# Patient Record
Sex: Male | Born: 1960 | Race: White | Hispanic: No | Marital: Married | Smoking: Current every day smoker
Health system: Southern US, Community
[De-identification: ages and names within clinical notes are randomized; demographics above are authoritative.]

## PROBLEM LIST (undated history)

## (undated) ENCOUNTER — Ambulatory Visit: Source: Home / Self Care

## (undated) DIAGNOSIS — I2510 Atherosclerotic heart disease of native coronary artery without angina pectoris: Secondary | ICD-10-CM

## (undated) DIAGNOSIS — E785 Hyperlipidemia, unspecified: Secondary | ICD-10-CM

## (undated) DIAGNOSIS — I214 Non-ST elevation (NSTEMI) myocardial infarction: Secondary | ICD-10-CM

## (undated) DIAGNOSIS — I10 Essential (primary) hypertension: Secondary | ICD-10-CM

## (undated) DIAGNOSIS — K219 Gastro-esophageal reflux disease without esophagitis: Secondary | ICD-10-CM

## (undated) DIAGNOSIS — F17200 Nicotine dependence, unspecified, uncomplicated: Secondary | ICD-10-CM

## (undated) HISTORY — DX: Non-ST elevation (NSTEMI) myocardial infarction: I21.4

## (undated) HISTORY — DX: Essential (primary) hypertension: I10

## (undated) HISTORY — DX: Gastro-esophageal reflux disease without esophagitis: K21.9

## (undated) HISTORY — PX: HAND SURGERY: SHX662

## (undated) HISTORY — DX: Hyperlipidemia, unspecified: E78.5

## (undated) HISTORY — DX: Nicotine dependence, unspecified, uncomplicated: F17.200

## (undated) HISTORY — DX: Atherosclerotic heart disease of native coronary artery without angina pectoris: I25.10

---

## 2000-11-18 ENCOUNTER — Encounter: Payer: Self-pay | Admitting: Emergency Medicine

## 2000-11-18 ENCOUNTER — Emergency Department (HOSPITAL_COMMUNITY): Admission: EM | Admit: 2000-11-18 | Discharge: 2000-11-18 | Payer: Self-pay | Admitting: Emergency Medicine

## 2000-11-22 ENCOUNTER — Ambulatory Visit (HOSPITAL_BASED_OUTPATIENT_CLINIC_OR_DEPARTMENT_OTHER): Admission: RE | Admit: 2000-11-22 | Discharge: 2000-11-22 | Payer: Self-pay | Admitting: Orthopedic Surgery

## 2000-12-04 ENCOUNTER — Encounter: Admission: RE | Admit: 2000-12-04 | Discharge: 2000-12-04 | Payer: Self-pay | Admitting: Orthopedic Surgery

## 2000-12-15 ENCOUNTER — Encounter: Admission: RE | Admit: 2000-12-15 | Discharge: 2001-03-15 | Payer: Self-pay | Admitting: Orthopedic Surgery

## 2007-02-05 ENCOUNTER — Emergency Department (HOSPITAL_COMMUNITY): Admission: EM | Admit: 2007-02-05 | Discharge: 2007-02-05 | Payer: Self-pay | Admitting: Emergency Medicine

## 2007-02-06 ENCOUNTER — Ambulatory Visit (HOSPITAL_COMMUNITY): Admission: RE | Admit: 2007-02-06 | Discharge: 2007-02-06 | Payer: Self-pay | Admitting: Emergency Medicine

## 2009-07-11 DIAGNOSIS — I2510 Atherosclerotic heart disease of native coronary artery without angina pectoris: Secondary | ICD-10-CM

## 2009-07-11 DIAGNOSIS — I214 Non-ST elevation (NSTEMI) myocardial infarction: Secondary | ICD-10-CM

## 2009-07-11 HISTORY — DX: Non-ST elevation (NSTEMI) myocardial infarction: I21.4

## 2009-07-11 HISTORY — DX: Atherosclerotic heart disease of native coronary artery without angina pectoris: I25.10

## 2010-01-28 ENCOUNTER — Ambulatory Visit: Payer: Self-pay | Admitting: Family Medicine

## 2010-01-28 ENCOUNTER — Inpatient Hospital Stay (HOSPITAL_COMMUNITY): Admission: EM | Admit: 2010-01-28 | Discharge: 2010-01-29 | Payer: Self-pay | Admitting: Emergency Medicine

## 2010-01-28 HISTORY — PX: CARDIAC CATHETERIZATION: SHX172

## 2010-02-12 ENCOUNTER — Ambulatory Visit: Payer: Self-pay | Admitting: Cardiology

## 2010-04-28 ENCOUNTER — Ambulatory Visit: Payer: Self-pay | Admitting: Cardiology

## 2010-09-25 LAB — CARDIAC PANEL(CRET KIN+CKTOT+MB+TROPI)
CK, MB: 57.1 ng/mL (ref 0.3–4.0)
Relative Index: 7.3 — ABNORMAL HIGH (ref 0.0–2.5)
Relative Index: 8.4 — ABNORMAL HIGH (ref 0.0–2.5)
Total CK: 681 U/L — ABNORMAL HIGH (ref 7–232)
Troponin I: 8.65 ng/mL (ref 0.00–0.06)

## 2010-09-25 LAB — BASIC METABOLIC PANEL
BUN: 10 mg/dL (ref 6–23)
CO2: 25 mEq/L (ref 19–32)
Calcium: 9.2 mg/dL (ref 8.4–10.5)
Chloride: 103 mEq/L (ref 96–112)
Chloride: 104 mEq/L (ref 96–112)
Creatinine, Ser: 0.79 mg/dL (ref 0.4–1.5)
GFR calc Af Amer: >60 mL/min (ref >60)
GFR calc Af Amer: >60 mL/min (ref >60)
GFR calc non Af Amer: >60 mL/min (ref >60)
Glucose, Bld: 140 mg/dL — ABNORMAL HIGH (ref 70–99)
Potassium: 3.7 mEq/L (ref 3.5–5.1)
Potassium: 4 mEq/L (ref 3.5–5.1)
Sodium: 136 mEq/L (ref 135–145)
Sodium: 137 mEq/L (ref 135–145)

## 2010-09-25 LAB — CBC
HCT: 41.8 % (ref 39.0–52.0)
Hemoglobin: 14.2 g/dL (ref 13.0–17.0)
Hemoglobin: 15.1 g/dL (ref 13.0–17.0)
MCHC: 34.1 g/dL (ref 30.0–36.0)
MCV: 98.6 fL (ref 78.0–100.0)
RBC: 4.24 MIL/uL (ref 4.22–5.81)
RBC: 4.5 MIL/uL (ref 4.22–5.81)
WBC: 10 10*3/uL (ref 4.0–10.5)

## 2010-09-25 LAB — LIPID PANEL
LDL Cholesterol: 97 mg/dL (ref 0–99)
VLDL: 49 mg/dL — ABNORMAL HIGH (ref 0–40)

## 2010-09-25 LAB — POCT CARDIAC MARKERS
CKMB, poc: <1 ng/mL — ABNORMAL LOW (ref 1.0–8.0)
Myoglobin, poc: 47.5 ng/mL (ref 12–200)
Myoglobin, poc: >500 ng/mL (ref 12–200)
Troponin i, poc: 0.32 ng/mL (ref 0.00–0.09)

## 2010-09-25 LAB — DIFFERENTIAL
Basophils Relative: 1 % (ref 0–1)
Lymphs Abs: 3.8 10*3/uL (ref 0.7–4.0)
Monocytes Relative: 6 % (ref 3–12)
Neutro Abs: 6.8 10*3/uL (ref 1.7–7.7)
Neutrophils Relative %: 59 % (ref 43–77)

## 2010-09-25 LAB — TSH: TSH: 1.783 u[IU]/mL (ref 0.350–4.500)

## 2010-09-25 LAB — CK TOTAL AND CKMB (NOT AT ARMC): CK, MB: 10.1 ng/mL (ref 0.3–4.0)

## 2010-09-25 LAB — MRSA PCR SCREENING: MRSA by PCR: NEGATIVE

## 2010-09-25 LAB — PROTIME-INR: Prothrombin Time: 13 seconds (ref 11.6–15.2)

## 2010-11-13 ENCOUNTER — Emergency Department: Payer: Self-pay | Admitting: Emergency Medicine

## 2010-11-26 NOTE — Op Note (Signed)
[place]  Patient:    Philip Thomas, Philip Thomas                       MRN: [id number] Proc. Date: 11/22/00 Adm. Date:  [id number] Attending:  Marlowe Shores                           Operative Report  PREOPERATIVE DIAGNOSIS:  Left hand dorsal laceration.  POSTOPERATIVE DIAGNOSIS:  Left hand dorsal laceration.  PROCEDURE:  Repair of left long finger extensor digitorum communis tendon.  SURGEON:  Artist Pais. Mina Marble, M.D.  ASSISTANT:  Junius Roads. Ireton, P.A.C.  ANESTHESIA:  Bier block.  TOURNIQUET TIME:  30 minutes.  COMPLICATIONS:  None.  DRAINS:  None.  DESCRIPTION OF PROCEDURE:  The patient was taken to the operating room and after induction of Bier block anesthesia, the left upper extremity was prepped and draped in the usual sterile fashion.  Once this was done a previous transverse laceration of the dorsal aspect of the left hand was reopened. Dissection was carried down to the dermal layers with an obvious laceration to the extensor digitorum communis tendon of the long finger was encountered. The EDC and EIP to the index were intact and the ring finger and small finger tendons were intact.  The tendon ends were debrided of clot and nonviable material and repair was then undertaken using 3-0 double armed Ethibond in a modified Tagima type suture followed by 6-0 running Prolene epitendinous stitch.  The wound was then thoroughly irrigated and closed with 3-0 Prolene subcuticular stitch.  Steri-Strips, 4 x 4 soft compressive dressing was applied, as well as a volar splint with fingers in extension.  The patient tolerated the procedure well and went to the recovery room in stable fashion. DD:  11/22/00 TD:  11/22/00 Job: [id number]

## 2010-12-24 ENCOUNTER — Encounter: Payer: Self-pay | Admitting: Cardiology

## 2010-12-24 ENCOUNTER — Other Ambulatory Visit: Payer: Self-pay | Admitting: *Deleted

## 2010-12-24 DIAGNOSIS — E785 Hyperlipidemia, unspecified: Secondary | ICD-10-CM

## 2010-12-30 ENCOUNTER — Other Ambulatory Visit: Payer: Self-pay | Admitting: Cardiology

## 2010-12-30 ENCOUNTER — Ambulatory Visit (INDEPENDENT_AMBULATORY_CARE_PROVIDER_SITE_OTHER): Payer: Self-pay | Admitting: Cardiology

## 2010-12-30 ENCOUNTER — Encounter: Payer: Self-pay | Admitting: Cardiology

## 2010-12-30 ENCOUNTER — Other Ambulatory Visit (INDEPENDENT_AMBULATORY_CARE_PROVIDER_SITE_OTHER): Payer: Self-pay | Admitting: *Deleted

## 2010-12-30 DIAGNOSIS — E785 Hyperlipidemia, unspecified: Secondary | ICD-10-CM | POA: Insufficient documentation

## 2010-12-30 DIAGNOSIS — I214 Non-ST elevation (NSTEMI) myocardial infarction: Secondary | ICD-10-CM | POA: Insufficient documentation

## 2010-12-30 DIAGNOSIS — I2510 Atherosclerotic heart disease of native coronary artery without angina pectoris: Secondary | ICD-10-CM

## 2010-12-30 DIAGNOSIS — I10 Essential (primary) hypertension: Secondary | ICD-10-CM

## 2010-12-30 DIAGNOSIS — F17200 Nicotine dependence, unspecified, uncomplicated: Secondary | ICD-10-CM | POA: Insufficient documentation

## 2010-12-30 DIAGNOSIS — I219 Acute myocardial infarction, unspecified: Secondary | ICD-10-CM

## 2010-12-30 LAB — BASIC METABOLIC PANEL
BUN: 8 mg/dL (ref 6–23)
CO2: 29 mEq/L (ref 19–32)
Chloride: 104 mEq/L (ref 96–112)
Creatinine, Ser: 0.9 mg/dL (ref 0.4–1.5)
Potassium: 4.5 mEq/L (ref 3.5–5.1)

## 2010-12-30 LAB — LDL CHOLESTEROL, DIRECT: Direct LDL: 85.9 mg/dL

## 2010-12-30 LAB — HEPATIC FUNCTION PANEL
AST: 42 U/L — ABNORMAL HIGH (ref 0–37)
Alkaline Phosphatase: 89 U/L (ref 39–117)
Total Bilirubin: 0.7 mg/dL (ref 0.3–1.2)

## 2010-12-30 LAB — LIPID PANEL
Total CHOL/HDL Ratio: 5
VLDL: 50.6 mg/dL — ABNORMAL HIGH (ref 0.0–40.0)

## 2010-12-30 NOTE — Patient Instructions (Signed)
We will call you with the results of your blood work today.  When you are ready to quit smoking let me know and we will prescribe Chantix for you. You need to quit!!  You need to get regular exercise--walking.  Reduce your ASA to 81 mg daily.  Continue your other medications.  I will see you back in 6 months.

## 2010-12-30 NOTE — Assessment & Plan Note (Signed)
He is doing well with minimal anginal symptoms. We will continue with aggressive risk factor modification. I would anticipate a followup stress test in one year.

## 2010-12-30 NOTE — Progress Notes (Signed)
   Philip Thomas Date of Birth: [birth year]   History of Present Illness: Philip Thomas is seen for followup today. He has been doing well from a cardiac standpoint. He's had very infrequent symptoms of mid sternal chest discomfort that are very mild and very short-lived. He has not had to use any nitroglycerin. He does continue to smoke one pack per day. He also admits that he is not exercising much. He is compliant with his medication.  Current Outpatient Prescriptions on File Prior to Visit  Medication Sig Dispense Refill  . acetaminophen (TYLENOL) 325 MG tablet Take 650 mg by mouth every 6 (six) hours as needed.        Marland Kitchen aspirin 325 MG tablet Take 325 mg by mouth daily.        . fish oil-omega-3 fatty acids 1000 MG capsule Take 4 g by mouth daily.        . metoprolol tartrate (LOPRESSOR) 25 MG tablet Take 12.5 mg by mouth 2 (two) times daily.        . nitroGLYCERIN (NITROSTAT) 0.4 MG SL tablet Place 0.4 mg under the tongue every 5 (five) minutes as needed.        Marland Kitchen omeprazole-sodium bicarbonate (ZEGERID) 40-1100 MG per capsule Take 1 capsule by mouth daily before breakfast.        . prasugrel (EFFIENT) 10 MG TABS Take 10 mg by mouth daily.        . simvastatin (ZOCOR) 40 MG tablet Take 40 mg by mouth at bedtime.          No Known Allergies  Past Medical History  Diagnosis Date  . MI, acute, non ST segment elevation   . Hypertension   . Dyslipidemia   . Tobacco dependence   . GERD (gastroesophageal reflux disease)   . Chest pain     Past Surgical History  Procedure Date  . Cardiac catheterization 01/28/2010  . Hand surgery     LEFT HAND    History  Smoking status  . Current Everyday Smoker -- 1.0 packs/day  Smokeless tobacco  . Not on file    History  Alcohol Use No    Family History  Problem Relation Age of Onset  . Hypertension Mother   . Stroke Mother   . Diabetes Mother   . Lung cancer Mother   . Emphysema Father     Review of Systems: The review of  systems is positive for continued tobacco abuse. He has had no bruising or bleeding.  All other systems were reviewed and are negative.  Physical Exam: BP 126/80  Pulse 64  Wt 182 lb (82.555 kg) He is an overweight white male in no acute distress. Is normocephalic, atraumatic. Pupils are equal round and reactive to light and accommodation. Extraocular movements are full. Oropharynx is clear. Neck is supple without JVD, adenopathy, thyromegaly, or bruits. Lungs are clear. Cardiac exam reveals a regular rate and rhythm without gallop or murmur. Abdomen is soft and nontender. He has no masses or bruits. Extremities are without edema. Pedal pulses are 2+. Neurologic exam is nonfocal. LABORATORY DATA: Chemistry panel and lipids are pending today.  Assessment / Plan:

## 2010-12-30 NOTE — Assessment & Plan Note (Signed)
We discussed smoking cessation strategies. He has failed nicotine replacement products in the past. I recommended Chantix when he is ready to quit. At this time he has not decided to quit but realizes that he needs to. When he has made his decision we will be happy to provide him with a prescription.

## 2010-12-30 NOTE — Assessment & Plan Note (Signed)
Blood pressure is well controlled on metoprolol. He will continue on this for blood pressure and for risk reduction post MI.

## 2010-12-30 NOTE — Assessment & Plan Note (Addendum)
We will followup on his fasting lab work today and make adjustments as needed. I have stressed the importance of a heart healthy diet and regular aerobic exercise.

## 2010-12-31 ENCOUNTER — Telehealth: Payer: Self-pay | Admitting: *Deleted

## 2010-12-31 DIAGNOSIS — E7800 Pure hypercholesterolemia, unspecified: Secondary | ICD-10-CM

## 2010-12-31 NOTE — Telephone Encounter (Signed)
Spoke w/wife. Notified her of lab work. Advised to get Niacin. Also scheduled app for 3 mo for repeat labs.

## 2010-12-31 NOTE — Telephone Encounter (Signed)
Message copied by Lorayne Bender on Fri Dec 31, 2010  1:13 PM ------      Message from: [place], PETER M      Created: Thu Dec 30, 2010  1:59 PM       Chemistries are ok. Triglycerides are high, HDL low, LDL better but not quite at goal. I would continue simvastatin and add niacin 500 mg daily. Increase to 1000 mg after 2 weeks. Recheck lab in 3 months.

## 2011-01-06 IMAGING — CR DG CHEST 2V
2 series · 2 of 2 positions shown · non-contrast
Comparison: None.

CLINICAL DATA: Chest pain.

CHEST - 2 VIEW

[w chest pa]
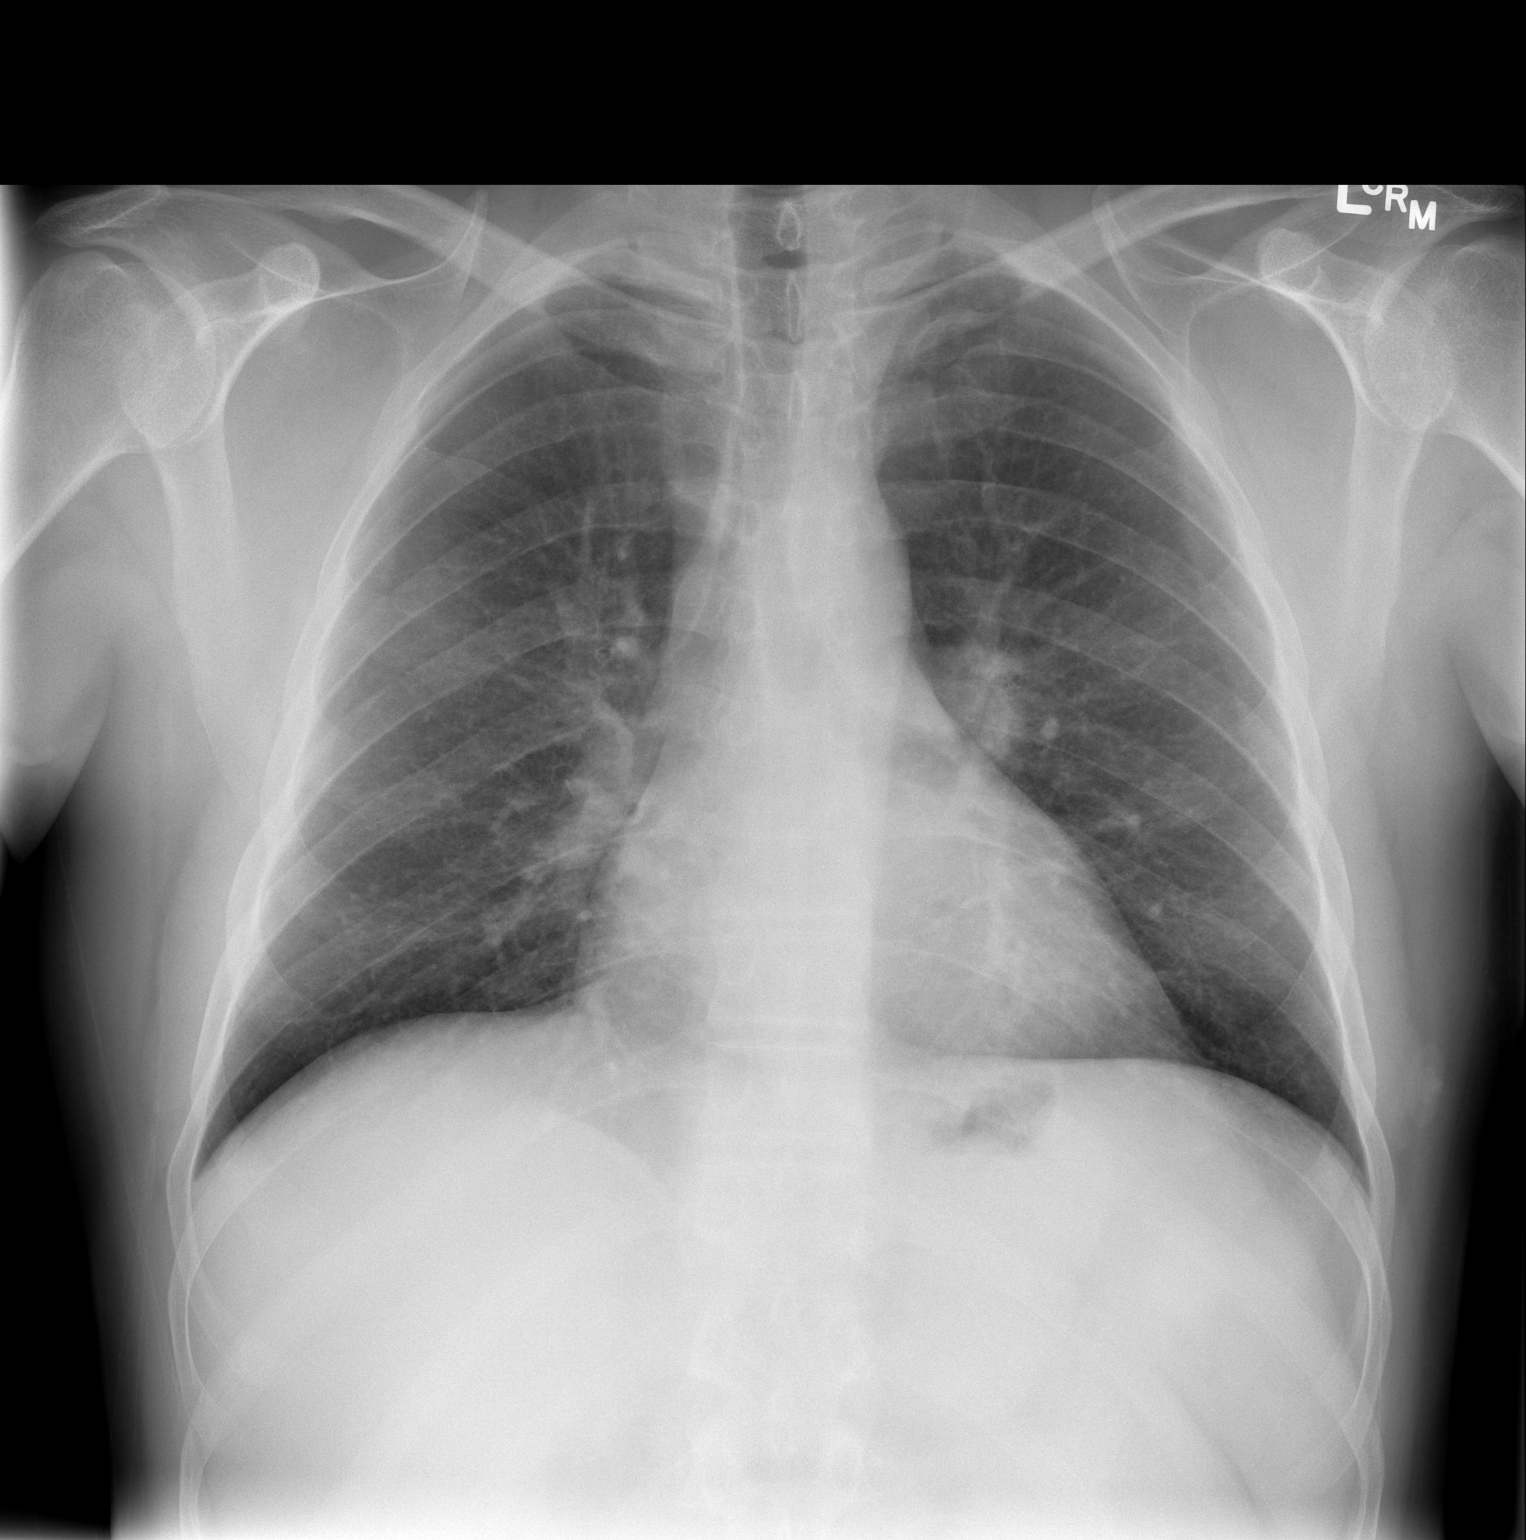

[w chest lat]
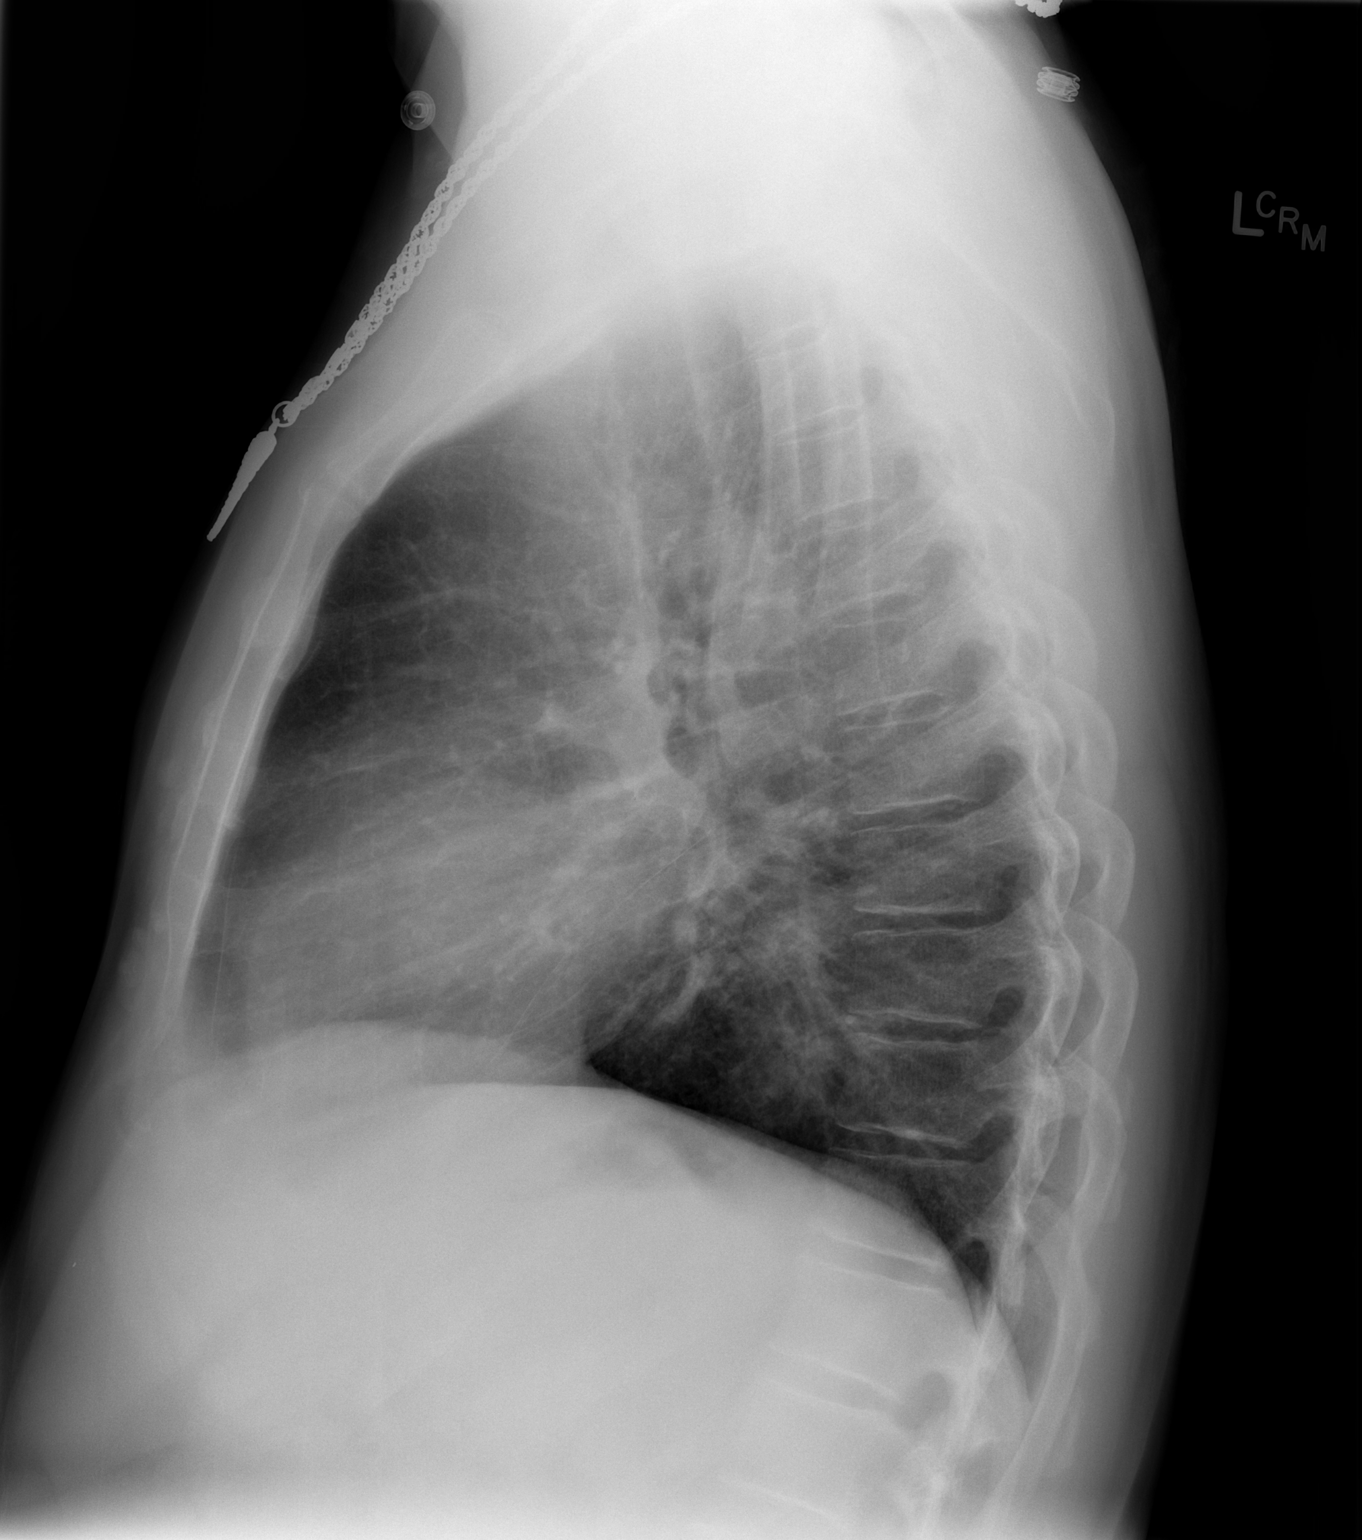

[2 of 2 positions shown; findings below may reference images not displayed]

FINDINGS: The heart size and mediastinal contours are within
normal limits.  Both lungs are clear.  The visualized skeletal
structures are unremarkable.
IMPRESSION: No active cardiopulmonary disease.

## 2011-01-07 ENCOUNTER — Encounter: Payer: Self-pay | Admitting: Cardiology

## 2011-01-14 ENCOUNTER — Telehealth: Payer: Self-pay | Admitting: Cardiology

## 2011-01-14 NOTE — Telephone Encounter (Signed)
Patient wife called about the [employer] application for patient assistance.  Someone called and said the application came in the mail.  She needs the physician page to send off.  Please call.

## 2011-01-14 NOTE — Telephone Encounter (Signed)
Wanted [place] to fill out physician portion of pt assist w/[facility] for Effient. Advised Dr. [place] would sign Mon and she wants me to fax to her at [phone].

## 2011-01-14 NOTE — Telephone Encounter (Signed)
CHART IN NURSES BOX.

## 2011-01-18 ENCOUNTER — Encounter: Payer: Self-pay | Admitting: Cardiology

## 2011-01-25 ENCOUNTER — Telehealth: Payer: Self-pay | Admitting: *Deleted

## 2011-01-25 NOTE — Telephone Encounter (Signed)
Notified that Effient (pt assis) was here. She will pick up.

## 2011-02-08 ENCOUNTER — Telehealth: Payer: Self-pay | Admitting: Cardiology

## 2011-02-08 NOTE — Telephone Encounter (Signed)
PT FAMILY CALLED, HE DOES NOT WANT TO TAKE THE NIACIN ANYMORE AND WANTS TO KNOW IF THERE IS SOMETHING ELSE??

## 2011-02-10 MED ORDER — METOPROLOL TARTRATE 25 MG PO TABS: 12.5000 mg | ORAL_TABLET | Freq: Two times a day (BID) | ORAL | Status: DC

## 2011-02-10 NOTE — Telephone Encounter (Signed)
Returned call after speaking w/Dr. [place]. Advised to d/c niacin and continue taking Fish Oil. Taking 1000 mg BID

## 2011-04-05 ENCOUNTER — Other Ambulatory Visit (INDEPENDENT_AMBULATORY_CARE_PROVIDER_SITE_OTHER): Payer: Self-pay | Admitting: *Deleted

## 2011-04-05 ENCOUNTER — Other Ambulatory Visit: Payer: Self-pay | Admitting: Cardiology

## 2011-04-05 DIAGNOSIS — E7800 Pure hypercholesterolemia, unspecified: Secondary | ICD-10-CM

## 2011-04-05 LAB — HEPATIC FUNCTION PANEL
ALT: 48 U/L (ref 0–53)
AST: 47 U/L — ABNORMAL HIGH (ref 0–37)
Albumin: 4 g/dL (ref 3.5–5.2)
Alkaline Phosphatase: 93 U/L (ref 39–117)
Total Protein: 7.2 g/dL (ref 6.0–8.3)

## 2011-04-05 LAB — BASIC METABOLIC PANEL
BUN: 10 mg/dL (ref 6–23)
CO2: 29 mEq/L (ref 19–32)
Chloride: 104 mEq/L (ref 96–112)
Glucose, Bld: 117 mg/dL — ABNORMAL HIGH (ref 70–99)
Potassium: 5 mEq/L (ref 3.5–5.1)
Sodium: 140 mEq/L (ref 135–145)

## 2011-04-05 LAB — LIPID PANEL: HDL: 31.3 mg/dL — ABNORMAL LOW (ref >39.00)

## 2011-04-05 LAB — LDL CHOLESTEROL, DIRECT: Direct LDL: 163.7 mg/dL

## 2011-04-06 ENCOUNTER — Telehealth: Payer: Self-pay | Admitting: *Deleted

## 2011-04-06 DIAGNOSIS — E7800 Pure hypercholesterolemia, unspecified: Secondary | ICD-10-CM

## 2011-04-06 MED ORDER — FENOFIBRATE 160 MG PO TABS: 160.0000 mg | ORAL_TABLET | Freq: Every day | ORAL | Status: DC

## 2011-04-06 MED ORDER — ATORVASTATIN CALCIUM 40 MG PO TABS: 40.0000 mg | ORAL_TABLET | Freq: Every day | ORAL | Status: DC

## 2011-04-06 NOTE — Telephone Encounter (Signed)
Notified wife of lab results. She states he has been out of Zocor and fish oil for "awhile". Will send Lipitor 40 mg and Fenofibrate 160 mg to [facility]. Will recheck labs in 3 mo

## 2011-04-06 NOTE — Telephone Encounter (Signed)
Message copied by Lorayne Bender on Wed Apr 06, 2011 12:08 PM ------      Message from: [place], PETER M      Created: Tue Apr 05, 2011  5:19 PM       Lipids are poorly controlled. It doesn't look like he's taking zocor.      I recommend stopping zocor. Start Lipitor 40 mg daily. Add fenofibrate 160 mg daily with food. Needs repeat lipid panel in 3 months.      Chemistries OK.      Theron Arista [place]

## 2011-06-08 ENCOUNTER — Other Ambulatory Visit: Payer: Self-pay | Admitting: *Deleted

## 2011-06-29 ENCOUNTER — Other Ambulatory Visit (INDEPENDENT_AMBULATORY_CARE_PROVIDER_SITE_OTHER): Payer: Self-pay | Admitting: *Deleted

## 2011-06-29 ENCOUNTER — Other Ambulatory Visit: Payer: Self-pay | Admitting: Cardiology

## 2011-06-29 ENCOUNTER — Ambulatory Visit (INDEPENDENT_AMBULATORY_CARE_PROVIDER_SITE_OTHER): Payer: Self-pay | Admitting: Cardiology

## 2011-06-29 ENCOUNTER — Encounter: Payer: Self-pay | Admitting: Cardiology

## 2011-06-29 ENCOUNTER — Other Ambulatory Visit: Payer: Self-pay | Admitting: *Deleted

## 2011-06-29 VITALS — BP 118/70 | HR 56 | Ht 64.0 in | Wt 185.0 lb

## 2011-06-29 DIAGNOSIS — I2510 Atherosclerotic heart disease of native coronary artery without angina pectoris: Secondary | ICD-10-CM

## 2011-06-29 DIAGNOSIS — E7800 Pure hypercholesterolemia, unspecified: Secondary | ICD-10-CM

## 2011-06-29 DIAGNOSIS — Z720 Tobacco use: Secondary | ICD-10-CM

## 2011-06-29 DIAGNOSIS — E785 Hyperlipidemia, unspecified: Secondary | ICD-10-CM

## 2011-06-29 DIAGNOSIS — I10 Essential (primary) hypertension: Secondary | ICD-10-CM

## 2011-06-29 DIAGNOSIS — F17200 Nicotine dependence, unspecified, uncomplicated: Secondary | ICD-10-CM

## 2011-06-29 LAB — BASIC METABOLIC PANEL
BUN: 11 mg/dL (ref 6–23)
Creatinine, Ser: 1 mg/dL (ref 0.4–1.5)
GFR: 83.21 mL/min (ref >60.00)
Glucose, Bld: 120 mg/dL — ABNORMAL HIGH (ref 70–99)

## 2011-06-29 LAB — HEPATIC FUNCTION PANEL
ALT: 31 U/L (ref 0–53)
Alkaline Phosphatase: 67 U/L (ref 39–117)
Bilirubin, Direct: 0 mg/dL (ref 0.0–0.3)
Total Protein: 7.1 g/dL (ref 6.0–8.3)

## 2011-06-29 LAB — LIPID PANEL
Cholesterol: 119 mg/dL (ref 0–200)
Total CHOL/HDL Ratio: 4

## 2011-06-29 NOTE — Assessment & Plan Note (Signed)
Prior lipid panel showed poor control. We have added fenofibrate to his Lipitor. We will recheck lipid panel today. Stressed the importance of dietary modification.

## 2011-06-29 NOTE — Progress Notes (Signed)
   Clayborn Heron Date of Birth: August 27, 1960   History of Present Illness: Mr. Barnhart is seen for followup today. He has been doing well from a cardiac standpoint. He denies any symptoms of chest pain or shortness of breath. He has not had to use any nitroglycerin. He has not made any progress with lifestyle modification. He does continue to smoke one and a half packs per day. He also admits that he is not exercising. He still has a fairly high fat intake in his diet. He is compliant with his medication.  Current Outpatient Prescriptions on File Prior to Visit  Medication Sig Dispense Refill  . acetaminophen (TYLENOL) 325 MG tablet Take 650 mg by mouth every 6 (six) hours as needed.        Marland Kitchen atorvastatin (LIPITOR) 40 MG tablet Take 1 tablet (40 mg total) by mouth daily.  30 tablet  5  . fenofibrate 160 MG tablet Take 1 tablet (160 mg total) by mouth daily.  30 tablet  5  . metoprolol tartrate (LOPRESSOR) 25 MG tablet Take 0.5 tablets (12.5 mg total) by mouth 2 (two) times daily.  90 tablet  3  . nitroGLYCERIN (NITROSTAT) 0.4 MG SL tablet Place 0.4 mg under the tongue every 5 (five) minutes as needed.        Marland Kitchen omeprazole-sodium bicarbonate (ZEGERID) 40-1100 MG per capsule Take 1 capsule by mouth daily before breakfast.        . prasugrel (EFFIENT) 10 MG TABS Take 10 mg by mouth daily.          No Known Allergies  Past Medical History  Diagnosis Date  . MI, acute, non ST segment elevation   . Hypertension   . Dyslipidemia   . Tobacco dependence   . GERD (gastroesophageal reflux disease)   . Chest pain   . CAD (coronary artery disease)     Past Surgical History  Procedure Date  . Cardiac catheterization 01/28/2010  . Hand surgery     LEFT HAND    History  Smoking status  . Current Everyday Smoker -- 1.0 packs/day  Smokeless tobacco  . Not on file    History  Alcohol Use No    Family History  Problem Relation Age of Onset  . Hypertension Mother   . Stroke Mother   .  Diabetes Mother   . Lung cancer Mother   . Emphysema Father     Review of Systems: The review of systems is positive for continued tobacco abuse. He has had no bruising or bleeding.  All other systems were reviewed and are negative.  Physical Exam: BP 118/70  Pulse 56  Ht 5\' 4"  (1.626 m)  Wt 83.915 kg (185 lb)  BMI 31.76 kg/m2 He is an overweight white male in no acute distress. He is normocephalic, atraumatic. Pupils are equal round and reactive to light and accommodation. Extraocular movements are full. Oropharynx is clear. Neck is supple without JVD, adenopathy, thyromegaly, or bruits. Lungs are clear. Cardiac exam reveals a regular rate and rhythm without gallop or murmur. Abdomen is soft and nontender. He has no masses or bruits. Extremities are without edema. Pedal pulses are 2+. Neurologic exam is nonfocal. LABORATORY DATA: Chemistry panel and lipids are pending today. ECG today shows sinus bradycardia with a rate of 56 beats per minute. It is otherwise normal. Assessment / Plan:

## 2011-06-29 NOTE — Telephone Encounter (Signed)
Was in office today. Needs refill on Effient 10 mg from [facility]. Form filled out and faxed to [phone]

## 2011-06-29 NOTE — Assessment & Plan Note (Addendum)
He remains asymptomatic. I stressed the importance of lifestyle modification including smoking cessation, heart healthy diet, and regular aerobic exercise. He will continue with his current medical therapy. He currently has patient assistance for Effient. If this expires we will need to switch him to Plavix. I will plan on seeing him back again in 6 months and would consider stress test at that time.

## 2011-06-29 NOTE — Assessment & Plan Note (Signed)
Discussed smoking cessation strategies at length for at least 10 minutes. Considered pharmacologic therapy. Patient is not ready to quit at this time.

## 2011-06-29 NOTE — Patient Instructions (Addendum)
We will call with the results of your lab work today.  You need to quit smoking. When you are ready to quit let me know and we can help with medication.  You need to reduce the saturated fats in your diet. Use olive oil. Eat more fresh fruits and vegetables, beans, fish, and whole grain.  I will see you again in 6 months. We will probably do a stress test next year.  You need regular aerobic exercise 30 minutes a day.

## 2011-10-07 ENCOUNTER — Other Ambulatory Visit: Payer: Self-pay | Admitting: Cardiology

## 2011-11-22 ENCOUNTER — Telehealth: Payer: Self-pay | Admitting: Cardiology

## 2011-11-22 NOTE — Telephone Encounter (Signed)
F/U   Patient wife Lanora Manis calling for status, she can be reached at [phone].

## 2011-11-22 NOTE — Telephone Encounter (Signed)
New msg Pt's wife wants to know about paperwork for effient.

## 2011-11-22 NOTE — Telephone Encounter (Signed)
Patient's wife called stated they have not received effient yet. Effient samples left at front desk 3rd floor.[facility] Form completed and will have Dr.Jordan sign tomorrow 11/23/11 and fax.Advised to call back if they dont receive.

## 2011-12-06 ENCOUNTER — Telehealth: Payer: Self-pay | Admitting: Cardiology

## 2011-12-06 NOTE — Telephone Encounter (Signed)
New msg Pt's wife called about effient. She said they get thru pt assistance. She hasnt heard anything

## 2011-12-06 NOTE — Telephone Encounter (Signed)
Spoke to patient's wife she stated has not received effient .[facility] [employer] assistance called they never received refill request .New refill request will be signed and faxed today 12/06/11 marked urgent case # [id number].

## 2011-12-20 ENCOUNTER — Telehealth: Payer: Self-pay | Admitting: Cardiology

## 2011-12-20 NOTE — Telephone Encounter (Signed)
Left pt a message to call back. 

## 2011-12-20 NOTE — Telephone Encounter (Signed)
New msg Pt's wife wanted to know status of pt assistance med-effient. Please call her back

## 2011-12-20 NOTE — Telephone Encounter (Signed)
Advised wife medication just came in and is ready to be picked up

## 2012-02-03 ENCOUNTER — Other Ambulatory Visit: Payer: Self-pay | Admitting: Cardiology

## 2012-04-26 ENCOUNTER — Telehealth: Payer: Self-pay | Admitting: Cardiology

## 2012-04-26 NOTE — Telephone Encounter (Signed)
Pt is almost out of meds and they where wondering has the information been sent to [facility] because they have not heard anything

## 2012-04-27 NOTE — Telephone Encounter (Signed)
Patient called was told [facility] Form for Effient faxed today 04/27/12.

## 2012-04-27 NOTE — Telephone Encounter (Signed)
Spoke to patient's wife she stated she faxed a form from [facility] for patient's effient.Wife was told did not receive form,she will refax form now.Requested effient samples.Samples of effient 10mg   left at front desk 3rd floor.

## 2012-05-04 ENCOUNTER — Other Ambulatory Visit: Payer: Self-pay | Admitting: Cardiology

## 2012-05-07 ENCOUNTER — Telehealth: Payer: Self-pay

## 2012-05-07 NOTE — Telephone Encounter (Signed)
New problem:   Wife calling - F/U  on the [facility] [employer] program.      Message is attached to refill request.

## 2012-05-07 NOTE — Telephone Encounter (Signed)
Spoke to patient's wife was told have not heard from [facility] about Effient samples.Advised to call next week if I haven't called her.

## 2012-05-08 NOTE — Telephone Encounter (Signed)
Patient's wife called no answer.Left message on personal voice mail spoke to [facility] patient's application has expired.New application needs to be filled out and signed by Dr.Jordan along with proof of income.Advised to bring to office next week Tuesday 05/15/12 proof of patient's income and I will have Dr.Jordan sign new application and fax to [facility].

## 2012-05-15 ENCOUNTER — Telehealth: Payer: Self-pay | Admitting: Cardiology

## 2012-05-15 NOTE — Telephone Encounter (Signed)
Please return call to pt wife [phone] regarding [employer] programs f/u   Neoma Laming, LPN 09/81/1914 7:82 PM Signed  Patient called was told [facility] Form for Effient faxed today 04/27/12.  Neoma Laming, LPN 95/62/1308 65:78 AM Signed  Spoke to patient's wife she stated she faxed a form from [facility] for patient's effient.Wife was told did not receive form,she will refax form now.Requested effient samples.Samples of effient 10mg  left at front desk 3rd floor.   Philip Thomas 04/26/2012 1:39 PM Signed  Pt is almost out of meds and they where wondering has the information been sent to [facility] because they have not heard anything

## 2012-05-15 NOTE — Telephone Encounter (Signed)
Spoke to patient's wife was told [employer] form is completed just needs Dr.Jordan's signature.Advised need patient's proof of income to fax with form.Wife stated she will bring proof of income 05/16/12.

## 2012-05-17 NOTE — Telephone Encounter (Signed)
[employer] Form signed by Dr.Jordan, received patient's proof of income,forms faxed to [employer] # [phone].

## 2012-05-28 ENCOUNTER — Telehealth: Payer: Self-pay | Admitting: Cardiology

## 2012-05-28 NOTE — Telephone Encounter (Signed)
New problem:   status of [facility] - medication .

## 2012-05-28 NOTE — Telephone Encounter (Signed)
**Note De-Identified Cyndi Montejano Obfuscation** Lanora Manis is advised that the form has been sent to [facility] care and we are waiting on response. Will forward to Anabel Halon, Dr. Elvis Coil nurse.

## 2012-05-30 ENCOUNTER — Telehealth: Payer: Self-pay | Admitting: Cardiology

## 2012-05-30 NOTE — Telephone Encounter (Signed)
pt's wife calling re [facility] [employer] assitance program you discussed with her

## 2012-05-31 NOTE — Telephone Encounter (Signed)
See note 05/31/12.

## 2012-05-31 NOTE — Telephone Encounter (Signed)
Spoke with patient's wife 05/30/12 was told refaxed [facility] application.[facility] [employer] called 05/31/12 and they did receive application.Will follow up 05/31/12 to see if approved.

## 2012-06-01 NOTE — Telephone Encounter (Signed)
Patient called spoke to wife was told received fax from [facility], effient approved and has been shipped,will call back when we receive.

## 2012-06-08 ENCOUNTER — Other Ambulatory Visit: Payer: Self-pay

## 2012-06-08 MED ORDER — METOPROLOL TARTRATE 25 MG PO TABS: 12.5000 mg | ORAL_TABLET | Freq: Two times a day (BID) | ORAL | Status: DC

## 2012-06-12 NOTE — Telephone Encounter (Signed)
Patient's wife called was told received Effient from [employer] leave at 3rd floor front desk.

## 2012-08-28 ENCOUNTER — Telehealth: Payer: Self-pay | Admitting: Cardiology

## 2012-08-28 DIAGNOSIS — I10 Essential (primary) hypertension: Secondary | ICD-10-CM

## 2012-08-28 DIAGNOSIS — E785 Hyperlipidemia, unspecified: Secondary | ICD-10-CM

## 2012-08-28 NOTE — Telephone Encounter (Signed)
Pt needs tooth pulled , does he need any prep? [phone]

## 2012-08-28 NOTE — Telephone Encounter (Signed)
Left message for call back.

## 2012-09-06 NOTE — Telephone Encounter (Signed)
New Prob   Has a question regarding medication metoprolol. Would like to speak to nurse.

## 2012-09-07 NOTE — Telephone Encounter (Signed)
Spoke with patient's wife 09/06/12 she stated when she picked up metoprolol the pill was smaller pharmacist told her it was the same just changed suppliers that is why it was smaller.States this one is hard to cut in half.States will try to cut in half with pill cutter and if has problems will call back.Also states patient did not receive follow up visit.Appointment scheduled with Norma Fredrickson NP 09/17/12 will have fasting lab work that day.

## 2012-09-17 ENCOUNTER — Ambulatory Visit (INDEPENDENT_AMBULATORY_CARE_PROVIDER_SITE_OTHER): Payer: Self-pay | Admitting: Nurse Practitioner

## 2012-09-17 ENCOUNTER — Other Ambulatory Visit (INDEPENDENT_AMBULATORY_CARE_PROVIDER_SITE_OTHER): Payer: Self-pay

## 2012-09-17 ENCOUNTER — Encounter: Payer: Self-pay | Admitting: Nurse Practitioner

## 2012-09-17 VITALS — BP 130/70 | HR 56 | Ht 64.0 in | Wt 188.1 lb

## 2012-09-17 DIAGNOSIS — I2510 Atherosclerotic heart disease of native coronary artery without angina pectoris: Secondary | ICD-10-CM

## 2012-09-17 DIAGNOSIS — E785 Hyperlipidemia, unspecified: Secondary | ICD-10-CM

## 2012-09-17 DIAGNOSIS — I10 Essential (primary) hypertension: Secondary | ICD-10-CM

## 2012-09-17 LAB — LDL CHOLESTEROL, DIRECT: Direct LDL: 69.2 mg/dL

## 2012-09-17 LAB — BASIC METABOLIC PANEL
Calcium: 9.1 mg/dL (ref 8.4–10.5)
Chloride: 106 mEq/L (ref 96–112)
Creatinine, Ser: 1 mg/dL (ref 0.4–1.5)

## 2012-09-17 LAB — HEPATIC FUNCTION PANEL
AST: 34 U/L (ref 0–37)
Total Bilirubin: 0.8 mg/dL (ref 0.3–1.2)

## 2012-09-17 LAB — LIPID PANEL
Cholesterol: 126 mg/dL (ref 0–200)
Total CHOL/HDL Ratio: 6
VLDL: 54 mg/dL — ABNORMAL HIGH (ref 0.0–40.0)

## 2012-09-17 MED ORDER — METOPROLOL SUCCINATE ER 25 MG PO TB24: 25.0000 mg | ORAL_TABLET | Freq: Every day | ORAL | Status: DC

## 2012-09-17 NOTE — Patient Instructions (Addendum)
Try to work on your smoking.  We will check labs today  Stay active  We will try the extended release version of Metoprolol at 25 mg just once a day in the place of the Metoprolol tartrate  We will see you back in 6 months with fasting labs

## 2012-09-17 NOTE — Progress Notes (Signed)
Philip Thomas Date of Birth: [birth year] Medical Record #[id number]  History of Present Illness: Philip Thomas is seen back today for a follow up visit. He is seen for Dr. Swaziland. Last seen in December of 2012. He has known CAD with past stenting of the proximal RCA with a DES, HLD, tobacco dependence, and GERD.   He comes in today. He is here with his wife. He is doing ok. No chest pain reported. Has stable DOE that is unchanged. Continues to smoke. Not ready to stop. Taking his medicines. Does note that his Lopressor is very small (supplier had changed) and it is very difficult to cut. They both are trying to get insurance and are currently uninsured. Does have some constant right shoulder pain with difficult ROM.   Current Outpatient Prescriptions on File Prior to Visit  Medication Sig Dispense Refill  . acetaminophen (TYLENOL) 325 MG tablet Take 650 mg by mouth every 6 (six) hours as needed.        Marland Kitchen aspirin 81 MG tablet Take 81 mg by mouth daily.        Marland Kitchen atorvastatin (LIPITOR) 40 MG tablet TAKE ONE TABLET BY MOUTH EVERY DAY  30 tablet  5  . fenofibrate 160 MG tablet TAKE ONE TABLET BY MOUTH EVERY DAY  30 tablet  5  . nitroGLYCERIN (NITROSTAT) 0.4 MG SL tablet Place 0.4 mg under the tongue every 5 (five) minutes as needed.        Marland Kitchen omeprazole-sodium bicarbonate (ZEGERID) 40-1100 MG per capsule Take 1 capsule by mouth daily before breakfast.        . prasugrel (EFFIENT) 10 MG TABS Take 10 mg by mouth daily.         No current facility-administered medications on file prior to visit.    No Known Allergies  Past Medical History  Diagnosis Date  . NSTEMI (non-ST elevated myocardial infarction) 2011  . Hypertension   . Dyslipidemia   . Tobacco dependence   . GERD (gastroesophageal reflux disease)   . CAD (coronary artery disease) 2011    DES to the proximal RCA    Past Surgical History  Procedure Laterality Date  . Cardiac catheterization  01/28/2010  . Hand surgery      LEFT HAND     History  Smoking status  . Current Every Day Smoker -- 1.00 packs/day  Smokeless tobacco  . Not on file    History  Alcohol Use No    Family History  Problem Relation Age of Onset  . Hypertension Mother   . Stroke Mother   . Diabetes Mother   . Lung cancer Mother   . Emphysema Father     Review of Systems: The review of systems is per the HPI.  All other systems were reviewed and are negative.  Physical Exam: BP 130/70  Pulse 56  Ht 5\' 4"  (1.626 m)  Wt 188 lb 1.9 oz (85.331 kg)  BMI 32.27 kg/m2 Patient is very pleasant and in no acute distress. Skin is warm and dry. Color is normal.  HEENT is unremarkable. Dentition is poor. Normocephalic/atraumatic. PERRL. Sclera are nonicteric. Neck is supple. No masses. No JVD. Lungs are coarse. Cardiac exam shows a regular rate and rhythm. Abdomen is soft. Extremities are without edema. Gait and ROM are intact. No gross neurologic deficits noted.'  LABORATORY DATA: Pending  Lab Results  Component Value Date   WBC 10.0 01/29/2010   HGB 14.2 01/29/2010   HCT 41.8 01/29/2010   PLT  226 01/29/2010   GLUCOSE 120* 06/29/2011   CHOL 119 06/29/2011   TRIG 214.0* 06/29/2011   HDL 27.80* 06/29/2011   LDLDIRECT 61.6 06/29/2011   LDLCALC  Value: 97        Total Cholesterol/HDL:CHD Risk Coronary Heart Disease Risk Table                     Men   Women  1/2 Average Risk   3.4   3.3  Average Risk       5.0   4.4  2 X Average Risk   9.6   7.1  3 X Average Risk  23.4   11.0        Use the calculated Patient Ratio above and the CHD Risk Table to determine the patient's CHD Risk.        ATP III CLASSIFICATION (LDL):  <100     mg/dL   Optimal  161-096  mg/dL   Near or Above                    Optimal  130-159  mg/dL   Borderline  045-409  mg/dL   High  >811     mg/dL   Very High 03/24/7828   ALT 31 06/29/2011   AST 29 06/29/2011   NA 140 06/29/2011   K 4.2 06/29/2011   CL 109 06/29/2011   CREATININE 1.0 06/29/2011   BUN 11 06/29/2011   CO2 24  06/29/2011   TSH 1.783 01/28/2010   INR 0.99 01/28/2010   HGBA1C  Value: 5.4 (NOTE)                                                                       According to the ADA Clinical Practice Recommendations for 2011, when HbA1c is used as a screening test:   >=6.5%   Diagnostic of Diabetes Mellitus           (if abnormal result  is confirmed)  5.7-6.4%   Increased risk of developing Diabetes Mellitus  References:Diagnosis and Classification of Diabetes Mellitus,Diabetes Care,2011,34(Suppl 1):S62-S69 and Standards of Medical Care in         Diabetes - 2011,Diabetes Care,2011,34  (Suppl 1):S11-S61. 01/28/2010   Assessment / Plan: 1. CAD with past NSTEMI and DES to the RCA. Minimal residual disease. Continues to smoke. No recurrent chest pain. I wanted to proceed with stress Myoview testing, but in light of his not having insurance, I held off. We will see him back in 6 months. He is currently without symptoms. Will try Toprol 25 mg daily in the place of his Lopressor and if the cost is not prohibitive.   2. HTN - BP is ok.   3. HLD - checking labs today  4. Tobacco abuse - he is not ready to stop.  We will tentatively see him back in 6 months. Smoking cessation was encouraged.   Patient is agreeable to this plan and will call if any problems develop in the interim.

## 2012-09-20 ENCOUNTER — Other Ambulatory Visit: Payer: Self-pay

## 2012-09-20 MED ORDER — OMEGA-3 FATTY ACIDS 1000 MG PO CAPS: ORAL_CAPSULE | ORAL | Status: DC

## 2012-10-09 ENCOUNTER — Telehealth: Payer: Self-pay | Admitting: Cardiology

## 2012-10-09 NOTE — Telephone Encounter (Signed)
Spoke with patient's wife was told will have Dr.Jordan sign [employer] form 10/10/12 and then will fax to [facility].Samples of effient 10 mg left at 3rd floor front desk.

## 2012-10-09 NOTE — Telephone Encounter (Signed)
New Problem:    Calleld in wanting to check on the status of her husbands refill of prasugrel (EFFIENT) 10 MG TABS through the [facility].  Please call back.

## 2012-10-15 NOTE — Telephone Encounter (Signed)
[employer] form for effient signed by Dr.Jordan and faxed to # [phone] 10/10/12.

## 2012-11-01 ENCOUNTER — Other Ambulatory Visit: Payer: Self-pay | Admitting: Cardiology

## 2012-12-05 ENCOUNTER — Telehealth: Payer: Self-pay | Admitting: Cardiology

## 2012-12-05 NOTE — Telephone Encounter (Signed)
Returned call to patient spoke to his wife she stated husband had episode last night in which he became dizzy felt like objects spinning,nausea vomited x 2.No chest pain.No sob.Stated he was feeling better this morning.Advised if symptoms return he will need to see PCP.Appointment scheduled with Dr.Jordan 01/30/13.Advised to call sooner if needed.

## 2012-12-05 NOTE — Telephone Encounter (Signed)
New Prob     Wife would like to discuss some issues pt was having last night (dizziness and throwing up) with nurse. Please call.

## 2012-12-07 ENCOUNTER — Other Ambulatory Visit: Payer: Self-pay | Admitting: Cardiology

## 2013-01-30 ENCOUNTER — Encounter: Payer: Self-pay | Admitting: Cardiology

## 2013-01-30 ENCOUNTER — Ambulatory Visit (INDEPENDENT_AMBULATORY_CARE_PROVIDER_SITE_OTHER): Payer: BC Managed Care – PPO | Admitting: Cardiology

## 2013-01-30 VITALS — BP 130/70 | HR 62 | Ht 64.0 in | Wt 187.1 lb

## 2013-01-30 DIAGNOSIS — I10 Essential (primary) hypertension: Secondary | ICD-10-CM

## 2013-01-30 DIAGNOSIS — E785 Hyperlipidemia, unspecified: Secondary | ICD-10-CM

## 2013-01-30 DIAGNOSIS — I2510 Atherosclerotic heart disease of native coronary artery without angina pectoris: Secondary | ICD-10-CM

## 2013-01-30 DIAGNOSIS — F17200 Nicotine dependence, unspecified, uncomplicated: Secondary | ICD-10-CM

## 2013-01-30 NOTE — Patient Instructions (Signed)
Stop Effient.   Continue your other medication.  Stop smoking and eat a heart healthy diet.  Get regular aerobic exercise- walk 30 minutes a day.  I will see you in 6 months.

## 2013-01-30 NOTE — Progress Notes (Signed)
Clayborn Heron Date of Birth: March 11, 1961 Medical Record #[id number]  History of Present Illness: Philip Thomas is seen back today for a follow up visit. Marland Kitchen He has known CAD with past stenting of the proximal RCA with a DES in July of 2011, HLD, tobacco dependence, and GERD.  On followup today he reports that he is doing well. He did have an episode 3 weeks ago where he became acutely nauseated with vomiting, vertigo, and feeling like he was going to pass out. The symptoms all resolved and he has had no recurrence. He is not getting any regular exercise. His wife reports that he likes eating fried food and pork. He continues to smoke.  Current Outpatient Prescriptions on File Prior to Visit  Medication Sig Dispense Refill  . acetaminophen (TYLENOL) 325 MG tablet Take 650 mg by mouth every 6 (six) hours as needed.        Marland Kitchen aspirin 81 MG tablet Take 81 mg by mouth daily.        Marland Kitchen atorvastatin (LIPITOR) 40 MG tablet TAKE ONE TABLET BY MOUTH ONCE DAILY  30 tablet  3  . fenofibrate 160 MG tablet TAKE ONE TABLET BY MOUTH ONCE DAILY  30 tablet  3  . fish oil-omega-3 fatty acids 1000 MG capsule Take 2 Gms twice a day ( total 4 Gms daily )  100 capsule  6  . metoprolol succinate (TOPROL XL) 25 MG 24 hr tablet Take 1 tablet (25 mg total) by mouth daily.  30 tablet  11  . nitroGLYCERIN (NITROSTAT) 0.4 MG SL tablet Place 0.4 mg under the tongue every 5 (five) minutes as needed.         No current facility-administered medications on file prior to visit.    No Known Allergies  Past Medical History  Diagnosis Date  . NSTEMI (non-ST elevated myocardial infarction) 2011  . Hypertension   . Dyslipidemia   . Tobacco dependence   . GERD (gastroesophageal reflux disease)   . CAD (coronary artery disease) 2011    DES to the proximal RCA    Past Surgical History  Procedure Laterality Date  . Cardiac catheterization  01/28/2010  . Hand surgery      LEFT HAND    History  Smoking status  . Current Every Day  Smoker -- 1.00 packs/day  Smokeless tobacco  . Not on file    History  Alcohol Use No    Family History  Problem Relation Age of Onset  . Hypertension Mother   . Stroke Mother   . Diabetes Mother   . Lung cancer Mother   . Emphysema Father     Review of Systems: The review of systems is per the HPI.  All other systems were reviewed and are negative.  Physical Exam: BP 130/70  Pulse 62  Ht 5\' 4"  (1.626 m)  Wt 187 lb 2 oz (84.879 kg)  BMI 32.1 kg/m2 Patient is very pleasant and in no acute distress. Skin is warm and dry. Color is normal.  HEENT is unremarkable. Dentition is poor. Normocephalic/atraumatic. PERRL. Sclera are nonicteric. Neck is supple. No masses. No JVD. Lungs are coarse. Cardiac exam shows a regular rate and rhythm. Abdomen is soft. Extremities are without edema. Gait and ROM are intact. No gross neurologic deficits noted.'  LABORATORY DATA:   Lab Results  Component Value Date   WBC 10.0 01/29/2010   HGB 14.2 01/29/2010   HCT 41.8 01/29/2010   PLT 226 01/29/2010   GLUCOSE 116*  09/17/2012   CHOL 126 09/17/2012   TRIG 270.0* 09/17/2012   HDL 20.10* 09/17/2012   LDLDIRECT 69.2 09/17/2012   LDLCALC  Value: 97        Total Cholesterol/HDL:CHD Risk Coronary Heart Disease Risk Table                     Men   Women  1/2 Average Risk   3.4   3.3  Average Risk       5.0   4.4  2 X Average Risk   9.6   7.1  3 X Average Risk  23.4   11.0        Use the calculated Patient Ratio above and the CHD Risk Table to determine the patient's CHD Risk.        ATP III CLASSIFICATION (LDL):  <100     mg/dL   Optimal  536-644  mg/dL   Near or Above                    Optimal  130-159  mg/dL   Borderline  034-742  mg/dL   High  >595     mg/dL   Very High 6/38/7564   ALT 37 09/17/2012   AST 34 09/17/2012   NA 137 09/17/2012   K 4.1 09/17/2012   CL 106 09/17/2012   CREATININE 1.0 09/17/2012   BUN 12 09/17/2012   CO2 24 09/17/2012   TSH 1.783 01/28/2010   INR 0.99 01/28/2010   HGBA1C  Value: 5.4  (NOTE)                                                                       According to the ADA Clinical Practice Recommendations for 2011, when HbA1c is used as a screening test:   >=6.5%   Diagnostic of Diabetes Mellitus           (if abnormal result  is confirmed)  5.7-6.4%   Increased risk of developing Diabetes Mellitus  References:Diagnosis and Classification of Diabetes Mellitus,Diabetes Care,2011,34(Suppl 1):S62-S69 and Standards of Medical Care in         Diabetes - 2011,Diabetes Care,2011,34  (Suppl 1):S11-S61. 01/28/2010   Assessment / Plan: 1. CAD with past NSTEMI and DES to the RCA in 2011.  Continues to smoke. No recurrent chest pain. I recommended stopping Effient now. Continue aspirin. Continue statin therapy and metoprolol. I recommended smoking cessation. Have recommended daily aerobic exercise. I've counseled him on a heart healthy diet. I'll followup again in 6 months.  2. HTN - BP is ok.   3. HLD - checking labs today  4. Tobacco abuse - he is not ready to stop. Counseling given.

## 2013-04-08 ENCOUNTER — Other Ambulatory Visit: Payer: Self-pay | Admitting: Cardiology

## 2013-05-16 ENCOUNTER — Other Ambulatory Visit: Payer: Self-pay

## 2013-10-03 ENCOUNTER — Other Ambulatory Visit: Payer: Self-pay | Admitting: Nurse Practitioner

## 2013-10-14 ENCOUNTER — Encounter: Payer: Self-pay | Admitting: Cardiology

## 2013-11-05 ENCOUNTER — Other Ambulatory Visit: Payer: Self-pay | Admitting: Cardiology

## 2013-11-05 ENCOUNTER — Other Ambulatory Visit: Payer: Self-pay | Admitting: *Deleted

## 2013-11-05 MED ORDER — ATORVASTATIN CALCIUM 40 MG PO TABS: ORAL_TABLET | ORAL | Status: DC

## 2013-11-05 MED ORDER — FENOFIBRATE 160 MG PO TABS: ORAL_TABLET | ORAL | Status: DC

## 2013-12-06 ENCOUNTER — Other Ambulatory Visit: Payer: Self-pay

## 2013-12-06 MED ORDER — METOPROLOL SUCCINATE ER 25 MG PO TB24: ORAL_TABLET | ORAL | Status: DC

## 2013-12-30 ENCOUNTER — Ambulatory Visit (INDEPENDENT_AMBULATORY_CARE_PROVIDER_SITE_OTHER): Payer: BC Managed Care – PPO | Admitting: Cardiology

## 2013-12-30 ENCOUNTER — Encounter: Payer: Self-pay | Admitting: Cardiology

## 2013-12-30 VITALS — BP 130/78 | HR 56 | Ht 64.0 in | Wt 189.0 lb

## 2013-12-30 DIAGNOSIS — I10 Essential (primary) hypertension: Secondary | ICD-10-CM

## 2013-12-30 DIAGNOSIS — F17200 Nicotine dependence, unspecified, uncomplicated: Secondary | ICD-10-CM

## 2013-12-30 DIAGNOSIS — E785 Hyperlipidemia, unspecified: Secondary | ICD-10-CM

## 2013-12-30 DIAGNOSIS — I2510 Atherosclerotic heart disease of native coronary artery without angina pectoris: Secondary | ICD-10-CM

## 2013-12-30 LAB — HEPATIC FUNCTION PANEL
ALT: 41 U/L (ref 0–53)
AST: 46 U/L — ABNORMAL HIGH (ref 0–37)
Albumin: 4 g/dL (ref 3.5–5.2)
Alkaline Phosphatase: 58 U/L (ref 39–117)
Bilirubin, Direct: 0.1 mg/dL (ref 0.0–0.3)
TOTAL PROTEIN: 7.3 g/dL (ref 6.0–8.3)
Total Bilirubin: 0.5 mg/dL (ref 0.2–1.2)

## 2013-12-30 LAB — LIPID PANEL
CHOLESTEROL: 127 mg/dL (ref 0–200)
HDL: 25.7 mg/dL — AB (ref >39.00)
LDL Cholesterol: 56 mg/dL (ref 0–99)
NonHDL: 101.3
Total CHOL/HDL Ratio: 5
Triglycerides: 227 mg/dL — ABNORMAL HIGH (ref 0.0–149.0)
VLDL: 45.4 mg/dL — AB (ref 0.0–40.0)

## 2013-12-30 LAB — BASIC METABOLIC PANEL
BUN: 10 mg/dL (ref 6–23)
CO2: 24 mEq/L (ref 19–32)
Calcium: 9.4 mg/dL (ref 8.4–10.5)
Chloride: 106 mEq/L (ref 96–112)
Creatinine, Ser: 1 mg/dL (ref 0.4–1.5)
GFR: 87.45 mL/min (ref >60.00)
Glucose, Bld: 110 mg/dL — ABNORMAL HIGH (ref 70–99)
POTASSIUM: 4.2 meq/L (ref 3.5–5.1)
SODIUM: 138 meq/L (ref 135–145)

## 2013-12-30 MED ORDER — FENOFIBRATE 160 MG PO TABS: ORAL_TABLET | ORAL | Status: DC

## 2013-12-30 MED ORDER — METOPROLOL SUCCINATE ER 25 MG PO TB24: ORAL_TABLET | ORAL | Status: DC

## 2013-12-30 MED ORDER — ATORVASTATIN CALCIUM 40 MG PO TABS: ORAL_TABLET | ORAL | Status: DC

## 2013-12-30 NOTE — Patient Instructions (Signed)
Your physician recommends that you continue on your current medications as directed. Please refer to the Current Medication list given to you today.  Your physician recommends that you go to the lab today for a BMET, LIPID and Hepatic Panel  Your physician has requested that you have an exercise tolerance test. For further information please visit [email or web address] Please also follow instruction sheet, as given.  Your physician wants you to follow-up in: 1 year with Dr SwazilandJordan You will receive a reminder letter in the mail two months in advance. If you don't receive a letter, please call our office to schedule the follow-up appointment.

## 2013-12-30 NOTE — Progress Notes (Signed)
Philip HeronBobby W Thomas Date of Birth: 15-May-1961 Medical Record #[id number]  History of Present Illness: Philip ClicheBobby is seen back today for a follow up visit. Marland Kitchen. He has known CAD with past stenting of the proximal RCA with a DES in July of 2011, HLD, tobacco dependence, and GERD.  On followup today he reports that he is doing well. He denies any chest pain or palpitations. He does get SOB going up steps but continues to smoke heavily. He has made no dietary modifications. He is getting no aerobic activity. He does note that he gives out of energy easily.  Current Outpatient Prescriptions on File Prior to Visit  Medication Sig Dispense Refill  . acetaminophen (TYLENOL) 325 MG tablet Take 650 mg by mouth every 6 (six) hours as needed.        Marland Kitchen. aspirin 81 MG tablet Take 81 mg by mouth daily.        Marland Kitchen. esomeprazole (NEXIUM) 40 MG capsule Take 40 mg by mouth daily before breakfast.      . fish oil-omega-3 fatty acids 1000 MG capsule Take 2 Gms twice a day ( total 4 Gms daily )  100 capsule  6  . nitroGLYCERIN (NITROSTAT) 0.4 MG SL tablet Place 0.4 mg under the tongue every 5 (five) minutes as needed.         No current facility-administered medications on file prior to visit.    No Known Allergies  Past Medical History  Diagnosis Date  . NSTEMI (non-ST elevated myocardial infarction) 2011  . Hypertension   . Dyslipidemia   . Tobacco dependence   . GERD (gastroesophageal reflux disease)   . CAD (coronary artery disease) 2011    DES to the proximal RCA    Past Surgical History  Procedure Laterality Date  . Cardiac catheterization  01/28/2010  . Hand surgery      LEFT HAND    History  Smoking status  . Current Every Day Smoker -- 1.00 packs/day  Smokeless tobacco  . Not on file    History  Alcohol Use No    Family History  Problem Relation Age of Onset  . Hypertension Mother   . Stroke Mother   . Diabetes Mother   . Lung cancer Mother   . Emphysema Father     Review of  Systems: The review of systems is per the HPI.  All other systems were reviewed and are negative.  Physical Exam: BP 130/78  Pulse 56  Ht 5\' 4"  (1.626 m)  Wt 189 lb (85.73 kg)  BMI 32.43 kg/m2 Patient is very pleasant and in no acute distress. Skin is warm and dry. Color is normal.  HEENT is unremarkable. Dentition is poor. Normocephalic/atraumatic. PERRL. Sclera are nonicteric. Neck is supple. No masses. No JVD. Lungs are coarse. Cardiac exam shows a regular rate and rhythm. Abdomen is soft. Extremities are without edema. Gait and ROM are intact. No gross neurologic deficits noted.'  LABORATORY DATA:   Lab Results  Component Value Date   WBC 10.0 01/29/2010   HGB 14.2 01/29/2010   HCT 41.8 01/29/2010   PLT 226 01/29/2010   GLUCOSE 116* 09/17/2012   CHOL 126 09/17/2012   TRIG 270.0* 09/17/2012   HDL 20.10* 09/17/2012   LDLDIRECT 69.2 09/17/2012   LDLCALC  Value: 97        Total Cholesterol/HDL:CHD Risk Coronary Heart Disease Risk Table  Men   Women  1/2 Average Risk   3.4   3.3  Average Risk       5.0   4.4  2 X Average Risk   9.6   7.1  3 X Average Risk  23.4   11.0        Use the calculated Patient Ratio above and the CHD Risk Table to determine the patient's CHD Risk.        ATP III CLASSIFICATION (LDL):  <100     mg/dL   Optimal  161-096100-129  mg/dL   Near or Above                    Optimal  130-159  mg/dL   Borderline  045-409160-189  mg/dL   High  >811>190     mg/dL   Very High 9/14/78297/21/2011   ALT 37 09/17/2012   AST 34 09/17/2012   NA 137 09/17/2012   K 4.1 09/17/2012   CL 106 09/17/2012   CREATININE 1.0 09/17/2012   BUN 12 09/17/2012   CO2 24 09/17/2012   TSH 1.783 01/28/2010   INR 0.99 01/28/2010   HGBA1C  Value: 5.4 (NOTE)                                                                       According to the ADA Clinical Practice Recommendations for 2011, when HbA1c is used as a screening test:   >=6.5%   Diagnostic of Diabetes Mellitus           (if abnormal result  is confirmed)  5.7-6.4%    Increased risk of developing Diabetes Mellitus  References:Diagnosis and Classification of Diabetes Mellitus,Diabetes Care,2011,34(Suppl 1):S62-S69 and Standards of Medical Care in         Diabetes - 2011,Diabetes Care,2011,34  (Suppl 1):S11-S61. 01/28/2010   Assessment / Plan: 1. CAD with past NSTEMI and DES to the RCA in 2011.  Continues to smoke. No recurrent chest pain.  Continue aspirin. Continue statin therapy and metoprolol. I recommended smoking cessation. Have recommended daily aerobic exercise. I've counseled him on a heart healthy diet. I have recommended a GXT. I think we have him on appropriate medical therapy but I think his prognosis is poor unless he makes some lifestyle modifications.  2. HTN - BP is controlled.   3. HLD - will check fasting lab work.  4. Tobacco abuse - he is not ready to stop. Counseling given.

## 2014-02-04 ENCOUNTER — Encounter: Payer: BC Managed Care – PPO | Admitting: Physician Assistant

## 2014-12-09 ENCOUNTER — Other Ambulatory Visit: Payer: Self-pay

## 2014-12-09 MED ORDER — ATORVASTATIN CALCIUM 40 MG PO TABS: ORAL_TABLET | ORAL | Status: DC

## 2015-01-14 ENCOUNTER — Other Ambulatory Visit: Payer: Self-pay

## 2015-01-14 MED ORDER — FENOFIBRATE 160 MG PO TABS: ORAL_TABLET | ORAL | Status: DC

## 2015-01-14 MED ORDER — METOPROLOL SUCCINATE ER 25 MG PO TB24: ORAL_TABLET | ORAL | Status: DC

## 2015-03-24 ENCOUNTER — Other Ambulatory Visit: Payer: Self-pay | Admitting: Cardiology

## 2015-05-05 ENCOUNTER — Other Ambulatory Visit: Payer: Self-pay | Admitting: Cardiology

## 2015-06-26 ENCOUNTER — Other Ambulatory Visit: Payer: Self-pay | Admitting: Cardiology

## 2018-08-23 ENCOUNTER — Telehealth: Payer: Self-pay

## 2018-08-23 NOTE — Telephone Encounter (Signed)
Spoke with wife who was concerned about symptoms that she states her husband casually mentioned to her recently. She states that her husband told her that he has been having exertional chest pain, described as a 'twinge', along with cramp-like jaw pain off and on for 2 weeks. Per wife, the pain lasts for a minute or less and is relieved by rest. She is concerned because of his Hx of MI which she states he had in July 2011 and wanted advice on whether she should report to ED with him or wait to see Dr. Swaziland in March because she was told that was the next available appt. Advised her to have her husband report to the ED if symptoms return but that he would be scheduled for an appt on 08/27/2018 with new MD since last appt in 2015. Wife agreeable with this and verbalized understanding

## 2018-08-27 ENCOUNTER — Encounter: Payer: Self-pay | Admitting: Internal Medicine

## 2018-08-27 ENCOUNTER — Ambulatory Visit (INDEPENDENT_AMBULATORY_CARE_PROVIDER_SITE_OTHER): Payer: Self-pay | Admitting: Internal Medicine

## 2018-08-27 VITALS — BP 138/78 | HR 81 | Ht 63.0 in | Wt 188.0 lb

## 2018-08-27 DIAGNOSIS — E785 Hyperlipidemia, unspecified: Secondary | ICD-10-CM

## 2018-08-27 DIAGNOSIS — I200 Unstable angina: Secondary | ICD-10-CM

## 2018-08-27 DIAGNOSIS — I25110 Atherosclerotic heart disease of native coronary artery with unstable angina pectoris: Secondary | ICD-10-CM

## 2018-08-27 MED ORDER — ATORVASTATIN CALCIUM 40 MG PO TABS: 40.0000 mg | ORAL_TABLET | Freq: Every day | ORAL | 3 refills | Status: DC

## 2018-08-27 MED ORDER — METOPROLOL SUCCINATE ER 25 MG PO TB24: 25.0000 mg | ORAL_TABLET | Freq: Every day | ORAL | 3 refills | Status: DC

## 2018-08-27 NOTE — Patient Instructions (Signed)
Medication Instructions:  Your physician has recommended you make the following change in your medication:   1) START Metoprolol 25mg  daily. An Rx has been sent to your pharmacy 2) START Atorvastatin 40mg  daily. An Rx has been sent to your pharmacy  If you need a refill on your cardiac medications before your next appointment, please call your pharmacy.   Lab work: Lipid, Bmet, Cbc today  If you have labs (blood work) drawn today and your tests are completely normal, you will receive your results only by: Marland Kitchen MyChart Message (if you have MyChart) OR . A paper copy in the mail If you have any lab test that is abnormal or we need to change your treatment, we will call you to review the results.  Testing/Procedures: Your physician has requested that you have a cardiac catheterization. Cardiac catheterization is used to diagnose and/or treat various heart conditions. Doctors may recommend this procedure for a number of different reasons. The most common reason is to evaluate chest pain. Chest pain can be a symptom of coronary artery disease (CAD), and cardiac catheterization can show whether plaque is narrowing or blocking your heart's arteries. This procedure is also used to evaluate the valves, as well as measure the blood flow and oxygen levels in different parts of your heart. For further information please visit [email or web address] Please follow instruction sheet, as given.    Follow-Up: At [facility], you and your health needs are our priority.  As part of our continuing mission to provide you with exceptional heart care, we have created designated Provider Care Teams.  These Care Teams include your primary Cardiologist (physician) and Advanced Practice Providers (APPs -  Physician Assistants and Nurse Practitioners) who all work together to provide you with the care you need, when you need it. You will need a follow up appointment after the procedure.     You may see  Dr.Hilty or one of  the following Advanced Practice Providers on your designated Care Team: Azalee Course, [place] . Micah Flesher, PA-C  Any Other Special Instructions Will Be Listed Below (If [facility] MEDICAL GROUP [facility] CARDIOVASCULAR DIVISION [facility] [place] [address] [place] [address] Dept: [phone] Loc: [phone]  Philip Thomas  08/27/2018  You are scheduled for a Cardiac Catheterization on Thursday, February 20 with Dr. Peter Swaziland.  1. Please arrive at the [facility] (Main Entrance A) at [facility]: [address] [place], [place] [address] at 5:30 AM (This time is two hours before your procedure to ensure your preparation). Free valet parking service is available.   Special note: Every effort is made to have your procedure done on time. Please understand that emergencies sometimes delay scheduled procedures.  2. Diet: Do not eat solid foods after midnight.  The patient may have clear liquids until 5am upon the day of the procedure.  3. Labs: You will need to have blood drawn today Bmet, Cbc, Lipid  4. Medication instructions in preparation for your procedure:   Contrast Allergy: No    Current Outpatient Medications (Cardiovascular):  .  atorvastatin (LIPITOR) 40 MG tablet, Take 1 tablet (40 mg total) by mouth daily. .  metoprolol succinate (TOPROL XL) 25 MG 24 hr tablet, Take 1 tablet (25 mg total) by mouth daily.   Current Outpatient Medications (Analgesics):  .  acetaminophen (TYLENOL) 325 MG tablet, Take 650 mg by mouth every 6 (six) hours as needed.   Marland Kitchen  aspirin 81  MG tablet, Take 81 mg by mouth daily.     Current Outpatient Medications (Other):  .  esomeprazole (NEXIUM) 40 MG capsule, Take 40 mg by mouth daily before breakfast.    On the morning of your procedure, take your Aspirin and any morning medicines NOT listed above.  You may use sips of water.  5. Plan for one night stay--bring personal belongings. 6.  Bring a current list of your medications and current insurance cards. 7. You MUST have a responsible person to drive you home. 8. Someone MUST be with you the first 24 hours after you arrive home or your discharge will be delayed. 9. Please wear clothes that are easy to get on and off and wear slip-on shoes.  Thank you for allowing [place] to care for you!   -- [place] [facility]

## 2018-08-27 NOTE — Progress Notes (Signed)
OFFICE NOTE  Chief Complaint:  Chest pain, jaw pain  Primary Care Physician: Patient, No Pcp Per  HPI:  Philip Thomas is a 58 y.o. male with a past medial history significant for coronary artery disease with an STEMI in 2011 status post DES to the proximal RCA.  He also has dyslipidemia, hypertension and GERD as well as ongoing tobacco abuse.  He was last seen by Dr. Swaziland in 2015 at that time was not compliant with medications or smoking cessation.  He is therefore considered a new patient.  Since then he has had no further medical care.  He does not currently have insurance.  He is not taking any medications including aspirin on a regular basis.  Recently has been having worsening tightness in his chest and episodes of pain that are squeezing and associated with a pressure that radiates up to the jaw on both sides with occluded tightness in his throat.  Describes these episodes as intense, more 9-10 out of 10 in pain severity and they do improve eventually with rest however seem to be coming more frequently.  PMHx:  Past Medical History:  Diagnosis Date  . CAD (coronary artery disease) 2011   DES to the proximal RCA  . Dyslipidemia   . GERD (gastroesophageal reflux disease)   . Hypertension   . NSTEMI (non-ST elevated myocardial infarction) 2011  . Tobacco dependence     Past Surgical History:  Procedure Laterality Date  . CARDIAC CATHETERIZATION  01/28/2010  . HAND SURGERY     LEFT HAND    FAMHx:  Family History  Problem Relation Age of Onset  . Hypertension Mother   . Stroke Mother   . Diabetes Mother   . Lung cancer Mother   . Emphysema Father     SOCHx:   reports that he has been smoking. He has been smoking about 1.00 pack per day. He does not have any smokeless tobacco history on file. He reports that he does not drink alcohol or use drugs.  ALLERGIES:  No Known Allergies  ROS: Pertinent items noted in HPI and remainder of comprehensive ROS otherwise  negative.  HOME MEDS: Current Outpatient Medications on File Prior to Visit  Medication Sig Dispense Refill  . acetaminophen (TYLENOL) 325 MG tablet Take 650 mg by mouth every 6 (six) hours as needed.      Marland Kitchen aspirin 81 MG tablet Take 81 mg by mouth daily.      Marland Kitchen esomeprazole (NEXIUM) 40 MG capsule Take 40 mg by mouth daily before breakfast.     No current facility-administered medications on file prior to visit.     LABS/IMAGING: No results found for this or any previous visit (from the past 48 hour(s)). No results found.  LIPID PANEL:    Component Value Date/Time   CHOL 127 12/30/2013 1004   TRIG 227.0 (H) 12/30/2013 1004   HDL 25.70 (L) 12/30/2013 1004   CHOLHDL 5 12/30/2013 1004   VLDL 45.4 (H) 12/30/2013 1004   LDLCALC 56 12/30/2013 1004   LDLDIRECT 69.2 09/17/2012 0826     WEIGHTS: Wt Readings from Last 3 Encounters:  08/27/18 188 lb (85.3 kg)  12/30/13 189 lb (85.7 kg)  01/30/13 187 lb 2 oz (84.9 kg)    VITALS: BP 138/78 (BP Location: Right Arm, Patient Position: Sitting, Cuff Size: Normal)   Pulse 81   Ht 5\' 3"  (1.6 m)   Wt 188 lb (85.3 kg)   BMI 33.30 kg/m   EXAM:  General appearance: alert and no distress Neck: no carotid bruit, no JVD and thyroid not enlarged, symmetric, no tenderness/mass/nodules Lungs: clear to auscultation bilaterally Heart: regular rate and rhythm, S1, S2 normal, no murmur, click, rub or gallop Abdomen: soft, non-tender; bowel sounds normal; no masses,  no organomegaly Extremities: extremities normal, atraumatic, no cyanosis or edema Pulses: 2+ and symmetric Skin: Skin color, texture, turgor normal. No rashes or lesions Neurologic: Grossly normal Psych: Pleasant  EKG: Normal sinus rhythm 81, inferior infarct pattern-- personally reviewed  ASSESSMENT: 1. Unstable angina 2. Dyslipidemia 3. Hypertension 4. CAD with prior an STEMI and drug-eluting stent to the RCA (2011)  PLAN: 1.   Philip Thomas is describing unstable angina and  currently is on no medications.  His symptoms are quite typical for angina.  We discussed reason to do medical therapy and monitoring his symptoms versus direct catheterization.  I will of course restart medical therapy including aspirin, high potency statin and beta-blocker today.  We will go ahead and repeat labs including a fasting lipid profile and precath labs.  However given his symptoms which are keeping him from doing most activities, I would recommend a repeat heart catheterization.  I suspect he may have progressed in his coronary disease or perhaps could even have multivessel disease at this point.  I discussed the risk, benefits and alternatives heart catheterization he is agreeable to proceed.  We will try to schedule with Dr.Jordan since he he had previously cath the patient and knows him.  Kenneth C. Hilty, MD, FACC, FACP  [place]  [facility]  Medical Director of the Advanced Lipid Disorders &  Cardiovascular Risk Reduction Clinic Diplomate of the American Board of Clinical Lipidology Attending Cardiologist  Direct Dial: [phone]  Fax: [phone]  Website:  [email or web address]   Kenneth C Hilty 08/27/2018, 9:50 AM 

## 2018-08-27 NOTE — Addendum Note (Signed)
Addended by: Jarvis Newcomer on: 08/27/2018 10:14 AM   Modules accepted: Orders, SmartSet

## 2018-08-27 NOTE — H&P (View-Only) (Signed)
OFFICE NOTE  Chief Complaint:  Chest pain, jaw pain  Primary Care Physician: Patient, No Pcp Per  HPI:  Philip Thomas is a 58 y.o. male with a past medial history significant for coronary artery disease with an STEMI in 2011 status post DES to the proximal RCA.  He also has dyslipidemia, hypertension and GERD as well as ongoing tobacco abuse.  He was last seen by Dr. Swaziland in 2015 at that time was not compliant with medications or smoking cessation.  He is therefore considered a new patient.  Since then he has had no further medical care.  He does not currently have insurance.  He is not taking any medications including aspirin on a regular basis.  Recently has been having worsening tightness in his chest and episodes of pain that are squeezing and associated with a pressure that radiates up to the jaw on both sides with occluded tightness in his throat.  Describes these episodes as intense, more 9-10 out of 10 in pain severity and they do improve eventually with rest however seem to be coming more frequently.  PMHx:  Past Medical History:  Diagnosis Date  . CAD (coronary artery disease) 2011   DES to the proximal RCA  . Dyslipidemia   . GERD (gastroesophageal reflux disease)   . Hypertension   . NSTEMI (non-ST elevated myocardial infarction) 2011  . Tobacco dependence     Past Surgical History:  Procedure Laterality Date  . CARDIAC CATHETERIZATION  01/28/2010  . HAND SURGERY     LEFT HAND    FAMHx:  Family History  Problem Relation Age of Onset  . Hypertension Mother   . Stroke Mother   . Diabetes Mother   . Lung cancer Mother   . Emphysema Father     SOCHx:   reports that he has been smoking. He has been smoking about 1.00 pack per day. He does not have any smokeless tobacco history on file. He reports that he does not drink alcohol or use drugs.  ALLERGIES:  No Known Allergies  ROS: Pertinent items noted in HPI and remainder of comprehensive ROS otherwise  negative.  HOME MEDS: Current Outpatient Medications on File Prior to Visit  Medication Sig Dispense Refill  . acetaminophen (TYLENOL) 325 MG tablet Take 650 mg by mouth every 6 (six) hours as needed.      Marland Kitchen aspirin 81 MG tablet Take 81 mg by mouth daily.      Marland Kitchen esomeprazole (NEXIUM) 40 MG capsule Take 40 mg by mouth daily before breakfast.     No current facility-administered medications on file prior to visit.     LABS/IMAGING: No results found for this or any previous visit (from the past 48 hour(s)). No results found.  LIPID PANEL:    Component Value Date/Time   CHOL 127 12/30/2013 1004   TRIG 227.0 (H) 12/30/2013 1004   HDL 25.70 (L) 12/30/2013 1004   CHOLHDL 5 12/30/2013 1004   VLDL 45.4 (H) 12/30/2013 1004   LDLCALC 56 12/30/2013 1004   LDLDIRECT 69.2 09/17/2012 0826     WEIGHTS: Wt Readings from Last 3 Encounters:  08/27/18 188 lb (85.3 kg)  12/30/13 189 lb (85.7 kg)  01/30/13 187 lb 2 oz (84.9 kg)    VITALS: BP 138/78 (BP Location: Right Arm, Patient Position: Sitting, Cuff Size: Normal)   Pulse 81   Ht 5\' 3"  (1.6 m)   Wt 188 lb (85.3 kg)   BMI 33.30 kg/m   EXAM:  General appearance: alert and no distress Neck: no carotid bruit, no JVD and thyroid not enlarged, symmetric, no tenderness/mass/nodules Lungs: clear to auscultation bilaterally Heart: regular rate and rhythm, S1, S2 normal, no murmur, click, rub or gallop Abdomen: soft, non-tender; bowel sounds normal; no masses,  no organomegaly Extremities: extremities normal, atraumatic, no cyanosis or edema Pulses: 2+ and symmetric Skin: Skin color, texture, turgor normal. No rashes or lesions Neurologic: Grossly normal Psych: Pleasant  EKG: Normal sinus rhythm 81, inferior infarct pattern-- personally reviewed  ASSESSMENT: 1. Unstable angina 2. Dyslipidemia 3. Hypertension 4. CAD with prior an STEMI and drug-eluting stent to the RCA (2011)  PLAN: 1.   Mr. Yell is describing unstable angina and  currently is on no medications.  His symptoms are quite typical for angina.  We discussed reason to do medical therapy and monitoring his symptoms versus direct catheterization.  I will of course restart medical therapy including aspirin, high potency statin and beta-blocker today.  We will go ahead and repeat labs including a fasting lipid profile and precath labs.  However given his symptoms which are keeping him from doing most activities, I would recommend a repeat heart catheterization.  I suspect he may have progressed in his coronary disease or perhaps could even have multivessel disease at this point.  I discussed the risk, benefits and alternatives heart catheterization he is agreeable to proceed.  We will try to schedule with Dr.Jordan since he he had previously cath the patient and knows him.  Chrystie Nose, MD, [facility], FACP  [place]  [facility]  Medical Director of the Advanced Lipid Disorders &  Cardiovascular Risk Reduction Clinic Diplomate of the American Board of Clinical Lipidology Attending Cardiologist  Direct Dial: [phone]  Fax: [phone]  Website:  [email or web address] Nicely [email or web address] 08/27/2018, 9:50 AM

## 2018-08-28 LAB — CBC WITH DIFFERENTIAL/PLATELET
Basophils Absolute: 0.1 10*3/uL (ref 0.0–0.2)
Basos: 1 %
EOS (ABSOLUTE): 0.1 10*3/uL (ref 0.0–0.4)
Eos: 2 %
Hematocrit: 48.2 % (ref 37.5–51.0)
Hemoglobin: 16.8 g/dL (ref 13.0–17.7)
Immature Grans (Abs): 0 10*3/uL (ref 0.0–0.1)
Immature Granulocytes: 0 %
Lymphocytes Absolute: 1.8 10*3/uL (ref 0.7–3.1)
Lymphs: 33 %
MCH: 33.1 pg — ABNORMAL HIGH (ref 26.6–33.0)
MCHC: 34.9 g/dL (ref 31.5–35.7)
MCV: 95 fL (ref 79–97)
Monocytes Absolute: 0.5 10*3/uL (ref 0.1–0.9)
Monocytes: 9 %
Neutrophils Absolute: 3.1 10*3/uL (ref 1.4–7.0)
Neutrophils: 55 %
Platelets: 93 10*3/uL — CL (ref 150–450)
RBC: 5.07 x10E6/uL (ref 4.14–5.80)
RDW: 12.8 % (ref 11.6–15.4)
WBC: 5.6 10*3/uL (ref 3.4–10.8)

## 2018-08-28 LAB — BASIC METABOLIC PANEL
BUN / CREAT RATIO: 9 (ref 9–20)
BUN: 7 mg/dL (ref 6–24)
CO2: 21 mmol/L (ref 20–29)
Calcium: 9.5 mg/dL (ref 8.7–10.2)
Chloride: 100 mmol/L (ref 96–106)
Creatinine, Ser: 0.78 mg/dL (ref 0.76–1.27)
GFR calc non Af Amer: 99 mL/min/{1.73_m2} (ref >59)
GFR, EST AFRICAN AMERICAN: 114 mL/min/{1.73_m2} (ref >59)
Glucose: 277 mg/dL — ABNORMAL HIGH (ref 65–99)
Potassium: 4.1 mmol/L (ref 3.5–5.2)
Sodium: 137 mmol/L (ref 134–144)

## 2018-08-28 LAB — LIPID PANEL
Chol/HDL Ratio: 5.9 ratio — ABNORMAL HIGH (ref 0.0–5.0)
Cholesterol, Total: 196 mg/dL (ref 100–199)
HDL: 33 mg/dL — ABNORMAL LOW (ref >39)
LDL Calculated: 132 mg/dL — ABNORMAL HIGH (ref 0–99)
Triglycerides: 157 mg/dL — ABNORMAL HIGH (ref 0–149)
VLDL Cholesterol Cal: 31 mg/dL (ref 5–40)

## 2018-08-29 ENCOUNTER — Telehealth: Payer: Self-pay | Admitting: *Deleted

## 2018-08-29 ENCOUNTER — Encounter (HOSPITAL_COMMUNITY): Payer: Self-pay | Admitting: *Deleted

## 2018-08-29 NOTE — Telephone Encounter (Signed)
Pt contacted pre-catheterization scheduled at [facility] for: Thursday August 30, 2018 7:30 AM Verified arrival time and place: [facility] Main Entrance A at: 5:30 AM  No solid food after midnight prior to cath, clear liquids until 5 AM day of procedure. Contrast allergy: no Verified no diabetes medications.  AM meds can be  taken pre-cath with sip of water including: ASA 81 mg  Confirmed patient has responsible person to drive home post procedure and observe 24 hours after arriving home: yes

## 2018-08-30 ENCOUNTER — Ambulatory Visit (HOSPITAL_COMMUNITY)
Admission: RE | Admit: 2018-08-30 | Discharge: 2018-08-30 | Disposition: A | Discharge status: Home / Self Care | Payer: Self-pay | Attending: Cardiology | Admitting: Cardiology

## 2018-08-30 ENCOUNTER — Encounter (HOSPITAL_COMMUNITY)
Admission: RE | Disposition: A | Discharge status: Home / Self Care | Payer: Self-pay | Source: Home / Self Care | Attending: Cardiology

## 2018-08-30 ENCOUNTER — Encounter (HOSPITAL_COMMUNITY): Payer: Self-pay | Admitting: Cardiology

## 2018-08-30 DIAGNOSIS — I25110 Atherosclerotic heart disease of native coronary artery with unstable angina pectoris: Secondary | ICD-10-CM

## 2018-08-30 DIAGNOSIS — F17200 Nicotine dependence, unspecified, uncomplicated: Secondary | ICD-10-CM | POA: Diagnosis present

## 2018-08-30 DIAGNOSIS — I252 Old myocardial infarction: Secondary | ICD-10-CM | POA: Insufficient documentation

## 2018-08-30 DIAGNOSIS — Z8249 Family history of ischemic heart disease and other diseases of the circulatory system: Secondary | ICD-10-CM | POA: Insufficient documentation

## 2018-08-30 DIAGNOSIS — I2584 Coronary atherosclerosis due to calcified coronary lesion: Secondary | ICD-10-CM | POA: Insufficient documentation

## 2018-08-30 DIAGNOSIS — Z7982 Long term (current) use of aspirin: Secondary | ICD-10-CM | POA: Insufficient documentation

## 2018-08-30 DIAGNOSIS — Z823 Family history of stroke: Secondary | ICD-10-CM | POA: Insufficient documentation

## 2018-08-30 DIAGNOSIS — Z79899 Other long term (current) drug therapy: Secondary | ICD-10-CM | POA: Insufficient documentation

## 2018-08-30 DIAGNOSIS — I10 Essential (primary) hypertension: Secondary | ICD-10-CM | POA: Diagnosis present

## 2018-08-30 DIAGNOSIS — F17210 Nicotine dependence, cigarettes, uncomplicated: Secondary | ICD-10-CM | POA: Insufficient documentation

## 2018-08-30 DIAGNOSIS — K219 Gastro-esophageal reflux disease without esophagitis: Secondary | ICD-10-CM | POA: Insufficient documentation

## 2018-08-30 DIAGNOSIS — Z955 Presence of coronary angioplasty implant and graft: Secondary | ICD-10-CM

## 2018-08-30 DIAGNOSIS — I200 Unstable angina: Secondary | ICD-10-CM | POA: Diagnosis present

## 2018-08-30 DIAGNOSIS — I2510 Atherosclerotic heart disease of native coronary artery without angina pectoris: Secondary | ICD-10-CM | POA: Diagnosis present

## 2018-08-30 DIAGNOSIS — E785 Hyperlipidemia, unspecified: Secondary | ICD-10-CM | POA: Diagnosis present

## 2018-08-30 HISTORY — PX: LEFT HEART CATH AND CORONARY ANGIOGRAPHY: CATH118249

## 2018-08-30 HISTORY — PX: CORONARY STENT INTERVENTION: CATH118234

## 2018-08-30 LAB — PLATELET COUNT: Platelets: 100 10*3/uL — ABNORMAL LOW (ref 150–400)

## 2018-08-30 LAB — POCT ACTIVATED CLOTTING TIME: Activated Clotting Time: 527 seconds

## 2018-08-30 SURGERY — LEFT HEART CATH AND CORONARY ANGIOGRAPHY
Anesthesia: LOCAL

## 2018-08-30 MED ORDER — TICAGRELOR 90 MG PO TABS: 90.0000 mg | ORAL_TABLET | Freq: Two times a day (BID) | ORAL | 0 refills | Status: DC

## 2018-08-30 MED ORDER — HEPARIN (PORCINE) IN NACL 1000-0.9 UT/500ML-% IV SOLN
INTRAVENOUS | Status: AC
  Filled 2018-08-30: qty 500

## 2018-08-30 MED ORDER — NITROGLYCERIN 0.4 MG SL SUBL: 0.4000 mg | SUBLINGUAL_TABLET | SUBLINGUAL | 2 refills | Status: DC | PRN

## 2018-08-30 MED ORDER — TICAGRELOR 90 MG PO TABS: 90.0000 mg | ORAL_TABLET | Freq: Two times a day (BID) | ORAL | Status: DC

## 2018-08-30 MED ORDER — FENTANYL CITRATE (PF) 100 MCG/2ML IJ SOLN
INTRAMUSCULAR | Status: DC | PRN
  Administered 2018-08-30: 25 ug via INTRAVENOUS

## 2018-08-30 MED ORDER — SODIUM CHLORIDE 0.9 % WEIGHT BASED INFUSION
3.0000 mL/kg/h | INTRAVENOUS | Status: AC
  Administered 2018-08-30: 3 mL/kg/h via INTRAVENOUS

## 2018-08-30 MED ORDER — LIDOCAINE HCL (PF) 1 % IJ SOLN
INTRAMUSCULAR | Status: DC | PRN
  Administered 2018-08-30: 2 mL via INTRADERMAL

## 2018-08-30 MED ORDER — SODIUM CHLORIDE 0.9% FLUSH: 3.0000 mL | Freq: Two times a day (BID) | INTRAVENOUS | Status: DC

## 2018-08-30 MED ORDER — NITROGLYCERIN 1 MG/10 ML FOR IR/CATH LAB
INTRA_ARTERIAL | Status: AC
  Filled 2018-08-30: qty 10

## 2018-08-30 MED ORDER — SODIUM CHLORIDE 0.9 % IV SOLN: 250.0000 mL | INTRAVENOUS | Status: DC | PRN

## 2018-08-30 MED ORDER — MIDAZOLAM HCL 2 MG/2ML IJ SOLN
INTRAMUSCULAR | Status: DC | PRN
  Administered 2018-08-30: 1 mg via INTRAVENOUS

## 2018-08-30 MED ORDER — HYDRALAZINE HCL 20 MG/ML IJ SOLN: 5.0000 mg | INTRAMUSCULAR | Status: DC | PRN

## 2018-08-30 MED ORDER — FENTANYL CITRATE (PF) 100 MCG/2ML IJ SOLN
INTRAMUSCULAR | Status: AC
  Filled 2018-08-30: qty 2

## 2018-08-30 MED ORDER — TICAGRELOR 90 MG PO TABS
ORAL_TABLET | ORAL | Status: AC
  Filled 2018-08-30: qty 1

## 2018-08-30 MED ORDER — IOHEXOL 350 MG/ML SOLN
INTRAVENOUS | Status: DC | PRN
  Administered 2018-08-30: 120 mL via INTRACARDIAC

## 2018-08-30 MED ORDER — SODIUM CHLORIDE 0.9 % WEIGHT BASED INFUSION: 1.0000 mL/kg/h | INTRAVENOUS | Status: DC

## 2018-08-30 MED ORDER — ACETAMINOPHEN 325 MG PO TABS
650.0000 mg | ORAL_TABLET | ORAL | Status: DC | PRN
  Administered 2018-08-30: 650 mg via ORAL
  Filled 2018-08-30: qty 2

## 2018-08-30 MED ORDER — HEPARIN SODIUM (PORCINE) 1000 UNIT/ML IJ SOLN
INTRAMUSCULAR | Status: AC
  Filled 2018-08-30: qty 1

## 2018-08-30 MED ORDER — HEPARIN (PORCINE) IN NACL 1000-0.9 UT/500ML-% IV SOLN
INTRAVENOUS | Status: DC | PRN
  Administered 2018-08-30 (×2): 500 mL

## 2018-08-30 MED ORDER — HEPARIN SODIUM (PORCINE) 1000 UNIT/ML IJ SOLN
INTRAMUSCULAR | Status: DC | PRN
  Administered 2018-08-30: 4000 [IU] via INTRAVENOUS
  Administered 2018-08-30: 5000 [IU] via INTRAVENOUS

## 2018-08-30 MED ORDER — SODIUM CHLORIDE 0.9% FLUSH: 3.0000 mL | INTRAVENOUS | Status: DC | PRN

## 2018-08-30 MED ORDER — TICAGRELOR 90 MG PO TABS
ORAL_TABLET | ORAL | Status: DC | PRN
  Administered 2018-08-30: 180 mg via ORAL

## 2018-08-30 MED ORDER — ASPIRIN 81 MG PO CHEW: 81.0000 mg | CHEWABLE_TABLET | ORAL | Status: DC

## 2018-08-30 MED ORDER — VERAPAMIL HCL 2.5 MG/ML IV SOLN
INTRAVENOUS | Status: DC | PRN
  Administered 2018-08-30: 10 mL via INTRA_ARTERIAL

## 2018-08-30 MED ORDER — LABETALOL HCL 5 MG/ML IV SOLN: 10.0000 mg | INTRAVENOUS | Status: DC | PRN

## 2018-08-30 MED ORDER — LIDOCAINE HCL (PF) 1 % IJ SOLN
INTRAMUSCULAR | Status: AC
  Filled 2018-08-30: qty 30

## 2018-08-30 MED ORDER — ONDANSETRON HCL 4 MG/2ML IJ SOLN: 4.0000 mg | Freq: Four times a day (QID) | INTRAMUSCULAR | Status: DC | PRN

## 2018-08-30 MED ORDER — MIDAZOLAM HCL 2 MG/2ML IJ SOLN
INTRAMUSCULAR | Status: AC
  Filled 2018-08-30: qty 2

## 2018-08-30 MED ORDER — VERAPAMIL HCL 2.5 MG/ML IV SOLN
INTRAVENOUS | Status: AC
  Filled 2018-08-30: qty 2

## 2018-08-30 MED FILL — BRILINTA 90 MG TABLET: 90 | 30 days supply | Qty: 60 | Fill #0

## 2018-08-30 SURGICAL SUPPLY — 18 items
BALLN SAPPHIRE 2.5X15 (BALLOONS) ×2
BALLN SAPPHIRE ~~LOC~~ 3.25X12 (BALLOONS) ×2 IMPLANT
BALLOON SAPPHIRE 2.5X15 (BALLOONS) ×1 IMPLANT
CATH 5FR JL3.5 JR4 ANG PIG MP (CATHETERS) ×2 IMPLANT
CATH VISTA GUIDE 6FR JR4 (CATHETERS) ×2 IMPLANT
DEVICE RAD COMP TR BAND LRG (VASCULAR PRODUCTS) ×2 IMPLANT
GLIDESHEATH SLEND SS 6F .021 (SHEATH) ×2 IMPLANT
GUIDELINER 6F (CATHETERS) ×2 IMPLANT
GUIDEWIRE INQWIRE 1.5J.035X260 (WIRE) ×1 IMPLANT
INQWIRE 1.5J .035X260CM (WIRE) ×2
KIT ENCORE 26 ADVANTAGE (KITS) ×2 IMPLANT
KIT HEART LEFT (KITS) ×2 IMPLANT
PACK CARDIAC CATHETERIZATION (CUSTOM PROCEDURE TRAY) ×2 IMPLANT
STENT SYNERGY DES 3X20 (Permanent Stent) ×2 IMPLANT
SYR MEDRAD MARK 7 150ML (SYRINGE) ×2 IMPLANT
TRANSDUCER W/STOPCOCK (MISCELLANEOUS) ×2 IMPLANT
TUBING CIL FLEX 10 FLL-RA (TUBING) ×2 IMPLANT
WIRE ASAHI PROWATER 180CM (WIRE) ×2 IMPLANT

## 2018-08-30 NOTE — Interval H&P Note (Signed)
History and Physical Interval Note:  08/30/2018 7:05 AM  Philip Thomas  has presented today for surgery, with the diagnosis of ua  The various methods of treatment have been discussed with the patient and family. After consideration of risks, benefits and other options for treatment, the patient has consented to  Procedure(s): LEFT HEART CATH AND CORONARY ANGIOGRAPHY (N/A) as a surgical intervention .  The patient's history has been reviewed, patient examined, no change in status, stable for surgery.  I have reviewed the patient's chart and labs.  Questions were answered to the patient's satisfaction.    Cath Lab Visit (complete for each Cath Lab visit)  Clinical Evaluation Leading to the Procedure:   ACS: Yes.    Non-ACS:    Anginal Classification: CCS III  Anti-ischemic medical therapy: No Therapy  Non-Invasive Test Results: No non-invasive testing performed  Prior CABG: No previous CABG       Philip Thomas [facility] 08/30/2018 7:05 AM

## 2018-08-30 NOTE — Discharge Instructions (Signed)
Radial Site Care ° °This sheet gives you information about how to care for yourself after your procedure. Your health care provider may also give you more specific instructions. If you have problems or questions, contact your health care provider. °What can I expect after the procedure? °After the procedure, it is common to have: °· Bruising and tenderness at the catheter insertion area. °Follow these instructions at home: °Medicines °· Take over-the-counter and prescription medicines only as told by your health care provider. °Insertion site care °· Follow instructions from your health care provider about how to take care of your insertion site. Make sure you: °? Wash your hands with soap and water before you change your bandage (dressing). If soap and water are not available, use hand sanitizer. °? Change your dressing as told by your health care provider. °? Leave stitches (sutures), skin glue, or adhesive strips in place. These skin closures may need to stay in place for 2 weeks or longer. If adhesive strip edges start to loosen and curl up, you may trim the loose edges. Do not remove adhesive strips completely unless your health care provider tells you to do that. °· Check your insertion site every day for signs of infection. Check for: °? Redness, swelling, or pain. °? Fluid or blood. °? Pus or a bad smell. °? Warmth. °· Do not take baths, swim, or use a hot tub until your health care provider approves. °· You may shower 24-48 hours after the procedure, or as directed by your health care provider. °? Remove the dressing and gently wash the site with plain soap and water. °? Pat the area dry with a clean towel. °? Do not rub the site. That could cause bleeding. °· Do not apply powder or lotion to the site. °Activity ° °· For 24 hours after the procedure, or as directed by your health care provider: °? Do not flex or bend the affected arm. °? Do not push or pull heavy objects with the affected arm. °? Do not  drive yourself home from the hospital or clinic. You may drive 24 hours after the procedure unless your health care provider tells you not to. °? Do not operate machinery or power tools. °· Do not lift anything that is heavier than 10 lb (4.5 kg), or the limit that you are told, until your health care provider says that it is safe. °· Ask your health care provider when it is okay to: °? Return to work or school. °? Resume usual physical activities or sports. °? Resume sexual activity. °General instructions °· If the catheter site starts to bleed, raise your arm and put firm pressure on the site. If the bleeding does not stop, get help right away. This is a medical emergency. °· If you went home on the same day as your procedure, a responsible adult should be with you for the first 24 hours after you arrive home. °· Keep all follow-up visits as told by your health care provider. This is important. °Contact a health care provider if: °· You have a fever. °· You have redness, swelling, or yellow drainage around your insertion site. °Get help right away if: °· You have unusual pain at the radial site. °· The catheter insertion area swells very fast. °· The insertion area is bleeding, and the bleeding does not stop when you hold steady pressure on the area. °· Your arm or hand becomes pale, cool, tingly, or numb. °These symptoms may represent a serious problem   that is an emergency. Do not wait to see if the symptoms will go away. Get medical help right away. Call your local emergency services (911 in the [place] Do not drive yourself to the hospital. °Summary °· After the procedure, it is common to have bruising and tenderness at the site. °· Follow instructions from your health care provider about how to take care of your radial site wound. Check the wound every day for signs of infection. °· Do not lift anything that is heavier than 10 lb (4.5 kg), or the limit that you are told, until your health care provider says  that it is safe. °This information is not intended to replace advice given to you by your health care provider. Make sure you discuss any questions you have with your health care provider. °Document Released: 07/30/2010 Document Revised: 08/02/2017 Document Reviewed: 08/02/2017 °Elsevier Interactive Patient Education © 2019 Elsevier Inc. ° °

## 2018-08-30 NOTE — Progress Notes (Signed)
CARDIAC REHAB PHASE I   Pt and family educated in short stay on importance of ASA, Brilinta, and NTG. Pt given stent card, heart healthy and diabetic diets. Reviewed restrictions and exercise guidelines. Spoke with pt about smoking cessation, and pt states he is ready to quit, but does not yet have a plan. Will refer to CRP II [place].   [id number] Reynold Bowen, RN BSN 08/30/2018 11:59 AM

## 2018-08-30 NOTE — Discharge Summary (Signed)
Discharge Summary    Patient ID: Philip Thomas,  MRN: [id number], DOB/AGE: 11-27-1960 58 y.o.  Admit date: 08/30/2018 Discharge date: 08/30/2018  Primary Care Provider: Patient, No Pcp Per Primary Cardiologist: Dr. Rennis Golden   Discharge Diagnoses    Principal Problem:   Unstable angina [facility]) Active Problems:   Hypertension   Dyslipidemia   Tobacco dependence   CAD (coronary artery disease)   Allergies No Known Allergies  Diagnostic Studies/Procedures    Cath: 08/30/2018    Mid RCA lesion is 95% stenosed.  A drug-eluting stent was successfully placed using a STENT SYNERGY DES 3X20.  Post intervention, there is a 0% residual stenosis.  Previously placed Prox RCA to Mid RCA stent (unknown type) is widely patent.  Prox LAD lesion is 40% stenosed.  Prox Cx lesion is 30% stenosed.  The left ventricular systolic function is normal.  LV end diastolic pressure is normal.  The left ventricular ejection fraction is 55-65% by visual estimate.   1. Single vessel obstructive CAD involving the mid RCA 2. Normal LV function 3. Normal LVEDP 4. Successful PCI of the mid RCA with DES x 1.   Plan: Patient is a candidate for same day DC. DAPT for one year. Will start with Brilinta. If unable to afford could switch to Plavix after one month. Risk factor modification.  _____________   History of Present Illness     58 y.o. male with a past medial history significant for coronary artery disease with an STEMI in 2011 status post DES to the proximal RCA.  He also has dyslipidemia, hypertension and GERD as well as ongoing tobacco abuse.  He was last seen by Dr. Swaziland in 2015 at that time was not compliant with medications or smoking cessation. Since then he had not had any further medical care.  He does not currently have insurance.  He was not taking any medications including aspirin on a regular basis.  Recently had been having worsening tightness in his chest and episodes of pain  that are squeezing and associated with a pressure that radiates up to the jaw on both sides with occluded tightness in his throat.  Described these episodes as intense, more 9-10 out of 10 in pain severity and they do improve eventually with rest however seem to be coming more frequently. Given his symptoms he was set up for outpatient cardiac cath.   Hospital Course     Underwent cardiac cath noted above with patent stent in the pRCA, with 95% mRCA, successful PCI/DESx1. Plan for DAPT with ASA/Brilinta for at least one year. Normal EF via LV gram. Other medications were continued the same. No complications noted post cath. Seen by cardiac rehab. Instructions/precautions given prior to discharge.    Philip Thomas was seen by Dr. Swaziland and determined stable for discharge home. Follow up in the office has been arranged. Medications are listed below.   _____________  Discharge Vitals Blood pressure (!) 164/74, pulse 61, temperature 98.4 F (36.9 C), temperature source Oral, resp. rate 15, height 5\' 4"  (1.626 m), weight 85.3 kg, SpO2 99 %.  Filed Weights   08/30/18 0538  Weight: 85.3 kg    Labs & Radiologic Studies    CBC Recent Labs    08/30/18 0606  PLT 100*   Basic Metabolic Panel No results for input(s): NA, K, CL, CO2, GLUCOSE, BUN, CREATININE, CALCIUM, MG, PHOS in the last 72 hours. Liver Function Tests No results for input(s): AST, ALT, ALKPHOS, BILITOT,  PROT, ALBUMIN in the last 72 hours. No results for input(s): LIPASE, AMYLASE in the last 72 hours. Cardiac Enzymes No results for input(s): CKTOTAL, CKMB, CKMBINDEX, TROPONINI in the last 72 hours. BNP Invalid input(s): POCBNP D-Dimer No results for input(s): DDIMER in the last 72 hours. Hemoglobin A1C No results for input(s): HGBA1C in the last 72 hours. Fasting Lipid Panel No results for input(s): CHOL, HDL, LDLCALC, TRIG, CHOLHDL, LDLDIRECT in the last 72 hours. Thyroid Function Tests No results for input(s): TSH,  T4TOTAL, T3FREE, THYROIDAB in the last 72 hours.  Invalid input(s): FREET3 _____________  No results found. Disposition   Pt is being discharged home today in good condition.  Follow-up Plans & Appointments    Follow-up Information    Azalee Course, [place] Follow up on 09/07/2018.   Specialties:  Cardiology, Radiology Why:  at 11:30am for your follow up appt Contact information: [address] [place] [address] [phone]          Discharge Instructions    Amb Referral to Cardiac Rehabilitation   Complete by:  As directed    Diagnosis:  Coronary Stents       Discharge Medications     Medication List    STOP taking these medications   ibuprofen 200 MG tablet Commonly known as:  ADVIL,MOTRIN     TAKE these medications   acetaminophen 325 MG tablet Commonly known as:  TYLENOL Take 650 mg by mouth every 6 (six) hours as needed for moderate pain or headache.   aspirin 81 MG tablet Take 81 mg by mouth daily.   atorvastatin 40 MG tablet Commonly known as:  LIPITOR Take 1 tablet (40 mg total) by mouth daily.   esomeprazole 20 MG capsule Commonly known as:  NEXIUM Take 20 mg by mouth every evening.   metoprolol succinate 25 MG 24 hr tablet Commonly known as:  TOPROL XL Take 1 tablet (25 mg total) by mouth daily.   nitroGLYCERIN 0.4 MG SL tablet Commonly known as:  NITROSTAT Place 1 tablet (0.4 mg total) under the tongue every 5 (five) minutes as needed.   ticagrelor 90 MG Tabs tablet Commonly known as:  BRILINTA Take 1 tablet (90 mg total) by mouth 2 (two) times daily.       Acute coronary syndrome (MI, NSTEMI, STEMI, etc) this admission?: No.    Outstanding Labs/Studies   n/a  Duration of Discharge Encounter   Greater than 30 minutes including physician time.  Signed, Laverda Page NP-C 08/30/2018, 1:00 PM

## 2018-08-31 ENCOUNTER — Other Ambulatory Visit: Payer: Self-pay | Admitting: Cardiology

## 2018-08-31 ENCOUNTER — Telehealth: Payer: Self-pay | Admitting: Cardiology

## 2018-08-31 MED ORDER — NITROGLYCERIN 0.4 MG SL SUBL: 0.4000 mg | SUBLINGUAL_TABLET | SUBLINGUAL | 2 refills | Status: DC | PRN

## 2018-08-31 NOTE — Telephone Encounter (Signed)
° °  Medication sent to wrong pharmacy. Please resend     *STAT* If patient is at the pharmacy, call can be transferred to refill team.   1. Which medications need to be refilled? (please list name of each medication and dose if known) nitroGLYCERIN (NITROSTAT) 0.4 MG SL tablet  2. Which pharmacy/location (including street and city if local pharmacy) is medication to be sent to?[id number] - [place] ([place]), [place] - [address]  3. Do they need a 30 day or 90 day supply? 30

## 2018-08-31 NOTE — Telephone Encounter (Signed)
Called and notified patient that I could give patient assistance forms and they could come get it here.  Patient wife states that they have an appointment next week and can get it then. I advised that was fine, and could give it to them at that appointment. Will route to [employer] to make her aware when they come in on 09/07/2018 advising of this. Thanks!

## 2018-08-31 NOTE — Telephone Encounter (Signed)
Rx(s) sent to pharmacy electronically.  

## 2018-08-31 NOTE — Telephone Encounter (Signed)
° ° °  Pt c/o medication issue:  1. Name of Medication: ticagrelor (BRILINTA) 90 MG TABS tablet  2. How are you currently taking this medication (dosage and times per day)? As written  3. Are you having a reaction (difficulty breathing--STAT)? no  4. What is your medication issue? Too costly, requesting assistance application or alternative

## 2018-09-05 NOTE — Telephone Encounter (Signed)
Thank you Raynelle Fanning. I will not be here on that day but I will pass the information on to [place] and the person that will be assisting him that day.

## 2018-09-05 NOTE — Telephone Encounter (Signed)
Noted. Thanks.

## 2018-09-06 ENCOUNTER — Telehealth (HOSPITAL_COMMUNITY): Payer: Self-pay

## 2018-09-06 NOTE — Telephone Encounter (Signed)
Called patient to see if he is interested in the Cardiac Rehab Program. Patient expressed interest, but stated that he would call back after speaking with is wife. Philip Thomas. Support Rep II

## 2018-09-07 ENCOUNTER — Ambulatory Visit (INDEPENDENT_AMBULATORY_CARE_PROVIDER_SITE_OTHER): Payer: Self-pay | Admitting: Physician Assistant

## 2018-09-07 VITALS — BP 122/72 | HR 70 | Ht 64.0 in | Wt 191.6 lb

## 2018-09-07 DIAGNOSIS — E785 Hyperlipidemia, unspecified: Secondary | ICD-10-CM

## 2018-09-07 DIAGNOSIS — R739 Hyperglycemia, unspecified: Secondary | ICD-10-CM

## 2018-09-07 DIAGNOSIS — I2510 Atherosclerotic heart disease of native coronary artery without angina pectoris: Secondary | ICD-10-CM

## 2018-09-07 DIAGNOSIS — Z720 Tobacco use: Secondary | ICD-10-CM

## 2018-09-07 DIAGNOSIS — D696 Thrombocytopenia, unspecified: Secondary | ICD-10-CM

## 2018-09-07 NOTE — Patient Instructions (Signed)
Medication Instructions:  Your physician has recommended you make the following change in your medication:   If you need a refill on your cardiac medications before your next appointment, please call your pharmacy.   Lab work: TODAY:  HGBA1C & CBC 6-8 WEEKS:  FASTING LIPID & LFT  If you have labs (blood work) drawn today and your tests are completely normal, you will receive your results only by: Marland Kitchen MyChart Message (if you have MyChart) OR . A paper copy in the mail If you have any lab test that is abnormal or we need to change your treatment, we will call you to review the results.  Testing/Procedures: None ordered   Follow-Up: Your physician recommends that you schedule a follow-up appointment in: 3 MONTHS WITH DR. HILTY  Any Other Special Instructions Will Be Listed Below (If Applicable).

## 2018-09-07 NOTE — Progress Notes (Signed)
Cardiology Office Note    Date:  09/09/2018   ID:  Bb, Rozeboom 04/23/1961, MRN [id number]  PCP:  Patient, No Pcp Per  Cardiologist:  Dr. Rennis Golden   Chief Complaint  Patient presents with  . Follow-up    seen for Dr. Rennis Golden.    History of Present Illness:  Philip Thomas is a 58 y.o. male with past medical history of hypertension, tobacco abuse, hyperlipidemia, and CAD.  Patient had a STEMI in 2011 and underwent DES to proximal RCA.  He has failed to follow-up with cardiology service since 2015.  He was most recently seen by Dr. Rennis Golden with complaint of unstable angina.  Cardiac catheterization performed on 08/30/2018 showed 95% mid RCA lesion treated with a 3 x 20 mm Synergy DES, widely patent proximal to mid RCA stent that was previously placed, 40% proximal LAD, 30% left circumflex lesion, EF 55 to 65%.  He was discharged the same day after starting on aspirin and Brilinta.  It was recommended he continue DAPT for 1 year.  Patient denies any recent chest pain or shortness of breath after the stent placement.  He is currently on Brilinta, however Brilinta cost over $400 at this time.  We will try to get him some samples.  Meanwhile, I will sign the patient out for medication assistance program.  If he does not qualify, he will need to let us know so we can switch him to Plavix instead. I emphasized repeatedly he need to be on dual antiplatelet for 1 year.  I will obtain a CBC today to follow-up on his thrombocytopenia and also hemoglobin A1c given the elevated glucose level recently.  He has cut back on smoking.  We will see him back in 3 months.   Past Medical History:  Diagnosis Date  . CAD (coronary artery disease) 2011   DES to the proximal RCA  . Dyslipidemia   . GERD (gastroesophageal reflux disease)   . Hypertension   . NSTEMI (non-ST elevated myocardial infarction) (HCC) 2011  . Tobacco dependence     Past Surgical History:  Procedure Laterality Date  . CARDIAC  CATHETERIZATION  01/28/2010  . CORONARY STENT INTERVENTION N/A 08/30/2018   Procedure: CORONARY STENT INTERVENTION;  Surgeon: Swaziland, Peter M, MD;  Location: [facility] INVASIVE CV LAB;  Service: Cardiovascular;  Laterality: N/A;  . HAND SURGERY     LEFT HAND  . LEFT HEART CATH AND CORONARY ANGIOGRAPHY N/A 08/30/2018   Procedure: LEFT HEART CATH AND CORONARY ANGIOGRAPHY;  Surgeon: Swaziland, Peter M, MD;  Location: [facility] INVASIVE CV LAB;  Service: Cardiovascular;  Laterality: N/A;    Current Medications: Outpatient Medications Prior to Visit  Medication Sig Dispense Refill  . acetaminophen (TYLENOL) 325 MG tablet Take 650 mg by mouth every 6 (six) hours as needed for moderate pain or headache.     Marland Kitchen aspirin 81 MG tablet Take 81 mg by mouth daily.      Marland Kitchen atorvastatin (LIPITOR) 40 MG tablet Take 1 tablet (40 mg total) by mouth daily. 90 tablet 3  . esomeprazole (NEXIUM) 20 MG capsule Take 20 mg by mouth every evening.    . metoprolol succinate (TOPROL XL) 25 MG 24 hr tablet Take 1 tablet (25 mg total) by mouth daily. 90 tablet 3  . nitroGLYCERIN (NITROSTAT) 0.4 MG SL tablet Place 1 tablet (0.4 mg total) under the tongue every 5 (five) minutes as needed. 25 tablet 2  . ticagrelor (BRILINTA) 90 MG TABS tablet Take 1  tablet (90 mg total) by mouth 2 (two) times daily. 60 tablet 0   No facility-administered medications prior to visit.      Allergies:   Patient has no known allergies.   Social History   Socioeconomic History  . Marital status: Married    Spouse name: Not on file  . Number of children: 3  . Years of education: Not on file  . Highest education level: Not on file  Occupational History  . Occupation: roofer  Social Needs  . Financial resource strain: Not on file  . Food insecurity:    Worry: Not on file    Inability: Not on file  . Transportation needs:    Medical: Not on file    Non-medical: Not on file  Tobacco Use  . Smoking status: Current Every Day Smoker    Packs/day: 1.00  .  Smokeless tobacco: Never Used  Substance and Sexual Activity  . Alcohol use: No  . Drug use: No  . Sexual activity: Yes  Lifestyle  . Physical activity:    Days per week: Not on file    Minutes per session: Not on file  . Stress: Not on file  Relationships  . Social connections:    Talks on phone: Not on file    Gets together: Not on file    Attends religious service: Not on file    Active member of club or organization: Not on file    Attends meetings of clubs or organizations: Not on file    Relationship status: Not on file  Other Topics Concern  . Not on file  Social History Narrative  . Not on file     Family History:  The patient's family history includes Diabetes in his mother; Emphysema in his father; Hypertension in his mother; Lung cancer in his mother; Stroke in his mother.   ROS:   Please see the history of present illness.    ROS All other systems reviewed and are negative.   PHYSICAL EXAM:   VS:  BP 122/72   Pulse 70   Ht 5\' 4"  (1.626 m)   Wt 191 lb 9.6 oz (86.9 kg)   BMI 32.89 kg/m    GEN: Well nourished, well developed, in no acute distress  HEENT: normal  Neck: no JVD, carotid bruits, or masses Cardiac: RRR; no murmurs, rubs, or gallops,no edema  Respiratory:  clear to auscultation bilaterally, normal work of breathing GI: soft, nontender, nondistended, + BS MS: no deformity or atrophy  Skin: warm and dry, no rash Neuro:  Alert and Oriented x 3, Strength and sensation are intact Psych: euthymic mood, full affect  Wt Readings from Last 3 Encounters:  09/07/18 191 lb 9.6 oz (86.9 kg)  08/30/18 188 lb (85.3 kg)  08/27/18 188 lb (85.3 kg)      Studies/Labs Reviewed:   EKG:  EKG is ordered today.  The ekg ordered today demonstrates NSR with nonspecific T wave changes  Recent Labs: 08/27/2018: BUN 7; Creatinine, Ser 0.78; Potassium 4.1; Sodium 137 09/07/2018: Hemoglobin 15.1; Platelets 120   Lipid Panel    Component Value Date/Time   CHOL 196  08/27/2018 1005   TRIG 157 (H) 08/27/2018 1005   HDL 33 (L) 08/27/2018 1005   CHOLHDL 5.9 (H) 08/27/2018 1005   CHOLHDL 5 12/30/2013 1004   VLDL 45.4 (H) 12/30/2013 1004   LDLCALC 132 (H) 08/27/2018 1005   LDLDIRECT 69.2 09/17/2012 0826    Additional studies/ records that were reviewed today  include:   Cath 08/30/2018  Mid RCA lesion is 95% stenosed.  A drug-eluting stent was successfully placed using a STENT SYNERGY DES 3X20.  Post intervention, there is a 0% residual stenosis.  Previously placed Prox RCA to Mid RCA stent (unknown type) is widely patent.  Prox LAD lesion is 40% stenosed.  Prox Cx lesion is 30% stenosed.  The left ventricular systolic function is normal.  LV end diastolic pressure is normal.  The left ventricular ejection fraction is 55-65% by visual estimate.   1. Single vessel obstructive CAD involving the mid RCA 2. Normal LV function 3. Normal LVEDP 4. Successful PCI of the mid RCA with DES x 1.   Plan: Patient is a candidate for same day DC. DAPT for one year. Will start with Brilinta. If unable to afford could switch to Plavix after one month. Risk factor modification.    ASSESSMENT:    1. Coronary artery disease involving native coronary artery of native heart without angina pectoris   2. Hyperglycemia   3. Thrombocytopenia (HCC)   4. Dyslipidemia   5. Tobacco abuse      PLAN:  In order of problems listed above:  1. CAD: Recently underwent DES to RCA.  Continue aspirin and Brilinta.  I have filled out medication assistance form for the patient, if he does not qualify, that he will need to contact us so we can switch him to Plavix.  He is aware that he will need dual antiplatelet medication for at least a year to prevent in-stent restenosis.  2. Hyperlipidemia: Continue on the current statin.  Fasting lipid panel LFT in 6 to 8 weeks  3. Hyperglycemia: Obtain hemoglobin A1c today  4. Thrombocytopenia: Seen during the recent  hospitalization, will repeat CBC today.  5. Tobacco abuse: Although he has cut back on smoking, he has not completely stopped.  I emphasized on the importance of tobacco cessation.    Medication Adjustments/Labs and Tests Ordered: Current medicines are reviewed at length with the patient today.  Concerns regarding medicines are outlined above.  Medication changes, Labs and Tests ordered today are listed in the Patient Instructions below. Patient Instructions  Medication Instructions:  Your physician has recommended you make the following change in your medication:   If you need a refill on your cardiac medications before your next appointment, please call your pharmacy.   Lab work: TODAY:  HGBA1C & CBC 6-8 WEEKS:  FASTING LIPID & LFT  If you have labs (blood work) drawn today and your tests are completely normal, you will receive your results only by: Marland Kitchen MyChart Message (if you have MyChart) OR . A paper copy in the mail If you have any lab test that is abnormal or we need to change your treatment, we will call you to review the results.  Testing/Procedures: None ordered   Follow-Up: Your physician recommends that you schedule a follow-up appointment in: 3 MONTHS WITH DR. HILTY  Any Other Special Instructions Will Be Listed Below (If Applicable).       Ramond Dial, [place]  09/09/2018 11:33 PM    [facility] [address] [place], [place], [place]  [address] Phone: [phone]; Fax: [phone]

## 2018-09-08 LAB — CBC
Hematocrit: 43.9 % (ref 37.5–51.0)
Hemoglobin: 15.1 g/dL (ref 13.0–17.7)
MCH: 33 pg (ref 26.6–33.0)
MCHC: 34.4 g/dL (ref 31.5–35.7)
MCV: 96 fL (ref 79–97)
Platelets: 120 10*3/uL — ABNORMAL LOW (ref 150–450)
RBC: 4.58 x10E6/uL (ref 4.14–5.80)
RDW: 12.9 % (ref 11.6–15.4)
WBC: 6.3 10*3/uL (ref 3.4–10.8)

## 2018-09-08 LAB — HEMOGLOBIN A1C
Est. average glucose Bld gHb Est-mCnc: 252 mg/dL
HEMOGLOBIN A1C: 10.4 % — AB (ref 4.8–5.6)

## 2018-09-09 ENCOUNTER — Encounter: Payer: Self-pay | Admitting: Physician Assistant

## 2018-09-13 NOTE — Progress Notes (Signed)
The patient has been notified of the result and verbalized understanding.  All questions (if any) were answered. Dorris Fetch, CMA 09/13/2018 3:00 PM

## 2018-09-13 NOTE — Addendum Note (Signed)
Addended by: Evans Lance on: 09/13/2018 04:50 PM   Modules accepted: Orders

## 2018-09-21 NOTE — Telephone Encounter (Signed)
Called patient to see if he was interested in participating in the Cardiac Rehab Program. Patient stated not at this time, no reason given. ° °Closed referral °

## 2018-09-25 ENCOUNTER — Telehealth: Payer: Self-pay | Admitting: Internal Medicine

## 2018-09-25 NOTE — Telephone Encounter (Signed)
Patient called notified of samples at office.  Medication given: Brilinta 90 mg  Lot #: [id number] Expiration: 04/2021 Qty: 3 bottles   They are having issues getting the Brilinta - they signed up for patient assistance, but have been unable to get it due to making to much money. They are sending them other paperwork to fill out but that will be a few weeks. I advised I would route to nurse to make aware.   Thank you!

## 2018-09-25 NOTE — Telephone Encounter (Signed)
° ° °  Patient calling the office for samples of medication: ° ° °1.  What medication and dosage are you requesting samples for? Brilinta ° °2.  Are you currently out of this medication? no ° ° °

## 2018-11-21 ENCOUNTER — Other Ambulatory Visit: Payer: Self-pay

## 2018-11-21 DIAGNOSIS — E785 Hyperlipidemia, unspecified: Secondary | ICD-10-CM

## 2018-11-21 LAB — HEPATIC FUNCTION PANEL
ALT: 29 IU/L (ref 0–44)
AST: 28 IU/L (ref 0–40)
Albumin: 4 g/dL (ref 3.8–4.9)
Alkaline Phosphatase: 87 IU/L (ref 39–117)
Bilirubin Total: 0.8 mg/dL (ref 0.0–1.2)
Bilirubin, Direct: 0.35 mg/dL (ref 0.00–0.40)
Total Protein: 6.8 g/dL (ref 6.0–8.5)

## 2018-11-21 LAB — LIPID PANEL
Chol/HDL Ratio: 2.6 ratio (ref 0.0–5.0)
Cholesterol, Total: 97 mg/dL — ABNORMAL LOW (ref 100–199)
HDL: 37 mg/dL — ABNORMAL LOW (ref >39)
LDL Calculated: 42 mg/dL (ref 0–99)
Triglycerides: 90 mg/dL (ref 0–149)
VLDL Cholesterol Cal: 18 mg/dL (ref 5–40)

## 2018-11-21 NOTE — Progress Notes (Signed)
Bad cholesterol level improved, HDL (good cholesterol) level remain low. Overall, lipid panel has improved compare to 2 month ago. Liver function ok.

## 2018-11-26 NOTE — Progress Notes (Signed)
Patient was made aware to keep his upcoming follow-up appointment. He verbalized an understanding and all (if any) questions were answered

## 2018-12-04 ENCOUNTER — Telehealth: Payer: Self-pay | Admitting: Internal Medicine

## 2018-12-04 NOTE — Telephone Encounter (Signed)
Spoke with patient to change appointment type from in-office to virtual, which he agrees with - phone visit preferred.   He denies chest pain and quit eating fried foods.      Virtual Visit Pre-Appointment Phone Call  "I am calling you today to discuss your upcoming appointment. We are currently trying to limit exposure to the virus that causes COVID-19 by seeing patients at home rather than in the office."  1. "What is the BEST phone number to call the day of the visit?" - include this in appointment notes  2. "Do you have or have access to (through a family member/friend) a smartphone with video capability that we can use for your visit?" a. If yes - list this number in appt notes as "cell" (if different from BEST phone #) and list the appointment type as a VIDEO visit in appointment notes b. If no - list the appointment type as a PHONE visit in appointment notes  3. Confirm consent - "In the setting of the current Covid19 crisis, you are scheduled for a (phone or video) visit with your provider on (date) at (time).  Just as we do with many in-office visits, in order for you to participate in this visit, we must obtain consent.  If you'd like, I can send this to your mychart (if signed up) or email for you to review.  Otherwise, I can obtain your verbal consent now.  All virtual visits are billed to your insurance company just like a normal visit would be.  By agreeing to a virtual visit, we'd like you to understand that the technology does not allow for your provider to perform an examination, and thus may limit your provider's ability to fully assess your condition. If your provider identifies any concerns that need to be evaluated in person, we will make arrangements to do so.  Finally, though the technology is pretty good, we cannot assure that it will always work on either your or our end, and in the setting of a video visit, we may have to convert it to a phone-only visit.  In either  situation, we cannot ensure that we have a secure connection.  Are you willing to proceed?" STAFF: Did the patient verbally acknowledge consent to telehealth visit? Document YES/NO here: YES  4. Advise patient to be prepared - "Two hours prior to your appointment, go ahead and check your blood pressure, pulse, oxygen saturation, and your weight (if you have the equipment to check those) and write them all down. When your visit starts, your provider will ask you for this information. If you have an Apple Watch or Kardia device, please plan to have heart rate information ready on the day of your appointment. Please have a pen and paper handy nearby the day of the visit as well."  5. Give patient instructions for MyChart download to smartphone OR Doximity/[email or web address] as below if video visit (depending on what platform provider is using)  6. Inform patient they will receive a phone call 15 minutes prior to their appointment time (may be from unknown caller ID) so they should be prepared to answer    TELEPHONE CALL NOTE  DONNELLE OLMEDA has been deemed a candidate for a follow-up tele-health visit to limit community exposure during the Covid-19 pandemic. I spoke with the patient via phone to ensure availability of phone/video source, confirm preferred email & phone number, and discuss instructions and expectations.  I reminded Blayn Whetsell Burdette to be prepared with any  vital sign and/or heart rhythm information that could potentially be obtained via home monitoring, at the time of his visit. I reminded Mackenzy Cerasuolo Damiani to expect a phone call prior to his visit.  Lindell Spar, RN 12/04/2018 11:29 AM   INSTRUCTIONS FOR DOWNLOADING THE MYCHART APP TO SMARTPHONE  - The patient must first make sure to have activated MyChart and know their login information - If Apple, go to [employer] and type in MyChart in the search bar and download the app. If Android, ask patient to go to [employer] and type in [place]  in the search bar and download the app. The app is free but as with any other app downloads, their phone may require them to verify saved payment information or Apple/Android password.  - The patient will need to then log into the app with their MyChart username and password, and select [facility] as their healthcare provider to link the account. When it is time for your visit, go to the MyChart app, find appointments, and click Begin Video Visit. Be sure to Select Allow for your device to access the Microphone and Camera for your visit. You will then be connected, and your provider will be with you shortly.  **If they have any issues connecting, or need assistance please contact MyChart service desk [phone])**  **If using a computer, in order to ensure the best quality for their visit they will need to use either of the following Internet Browsers: [employer] or Google Chrome**  IF USING DOXIMITY or DOXY.ME - The patient will receive a link just prior to their visit by text.     FULL LENGTH CONSENT FOR TELE-HEALTH VISIT   I hereby voluntarily request, consent and authorize [facility] and its employed or contracted physicians, physician assistants, nurse practitioners or other licensed health care professionals (the Practitioner), to provide me with telemedicine health care services (the "Services") as deemed necessary by the treating Practitioner. I acknowledge and consent to receive the Services by the Practitioner via telemedicine. I understand that the telemedicine visit will involve communicating with the Practitioner through live audiovisual communication technology and the disclosure of certain medical information by electronic transmission. I acknowledge that I have been given the opportunity to request an in-person assessment or other available alternative prior to the telemedicine visit and am voluntarily participating in the telemedicine visit.  I understand  that I have the right to withhold or withdraw my consent to the use of telemedicine in the course of my care at any time, without affecting my right to future care or treatment, and that the Practitioner or I may terminate the telemedicine visit at any time. I understand that I have the right to inspect all information obtained and/or recorded in the course of the telemedicine visit and may receive copies of available information for a reasonable fee.  I understand that some of the potential risks of receiving the Services via telemedicine include:  Marland Kitchen Delay or interruption in medical evaluation due to technological equipment failure or disruption; . Information transmitted may not be sufficient (e.g. poor resolution of images) to allow for appropriate medical decision making by the Practitioner; and/or  . In rare instances, security protocols could fail, causing a breach of personal health information.  Furthermore, I acknowledge that it is my responsibility to provide information about my medical history, conditions and care that is complete and accurate to the best of my ability. I acknowledge that Practitioner's advice, recommendations, and/or  decision may be based on factors not within their control, such as incomplete or inaccurate data provided by me or distortions of diagnostic images or specimens that may result from electronic transmissions. I understand that the practice of medicine is not an exact science and that Practitioner makes no warranties or guarantees regarding treatment outcomes. I acknowledge that I will receive a copy of this consent concurrently upon execution via email to the email address I last provided but may also request a printed copy by calling the office of [facility].    I understand that my insurance will be billed for this visit.   I have read or had this consent read to me. . I understand the contents of this consent, which adequately explains the benefits and risks  of the Services being provided via telemedicine.  . I have been provided ample opportunity to ask questions regarding this consent and the Services and have had my questions answered to my satisfaction. . I give my informed consent for the services to be provided through the use of telemedicine in my medical care  By participating in this telemedicine visit I agree to the above.

## 2018-12-05 ENCOUNTER — Telehealth: Payer: Self-pay | Admitting: Internal Medicine

## 2018-12-05 NOTE — Telephone Encounter (Signed)
self pay(no insurance)/ consent/ smartphone/ my chart/ pre reg completed

## 2018-12-07 ENCOUNTER — Telehealth (INDEPENDENT_AMBULATORY_CARE_PROVIDER_SITE_OTHER): Payer: Self-pay | Admitting: Internal Medicine

## 2018-12-07 ENCOUNTER — Encounter: Payer: Self-pay | Admitting: Internal Medicine

## 2018-12-07 DIAGNOSIS — I2510 Atherosclerotic heart disease of native coronary artery without angina pectoris: Secondary | ICD-10-CM

## 2018-12-07 DIAGNOSIS — E785 Hyperlipidemia, unspecified: Secondary | ICD-10-CM

## 2018-12-07 DIAGNOSIS — I10 Essential (primary) hypertension: Secondary | ICD-10-CM

## 2018-12-07 DIAGNOSIS — E1165 Type 2 diabetes mellitus with hyperglycemia: Secondary | ICD-10-CM

## 2018-12-07 NOTE — Patient Instructions (Signed)
Medication Instructions:  Continue current medications  Jardiance - talk with PCP about this medication for diabetes  If you need a refill on your cardiac medications before your next appointment, please call your pharmacy.   Lab work: NONE  Testing/Procedures: NONE   Follow-Up: At [facility], you and your health needs are our priority.  As part of our continuing mission to provide you with exceptional heart care, we have created designated Provider Care Teams.  These Care Teams include your primary Cardiologist (physician) and Advanced Practice Providers (APPs -  Physician Assistants and Nurse Practitioners) who all work together to provide you with the care you need, when you need it. You will need a follow up appointment in 6 months.  Please call our office 2 months in advance to schedule this appointment.  You may see Chrystie Nose, MD or one of the following Advanced Practice Providers on your designated Care Team: [place], [place] . Micah Flesher, PA-C  Any Other Special Instructions Will Be Listed Below (If Applicable).

## 2018-12-07 NOTE — Progress Notes (Signed)
Virtual Visit via Telephone Note   This visit type was conducted due to national recommendations for restrictions regarding the COVID-19 Pandemic (e.g. social distancing) in an effort to limit this patient's exposure and mitigate transmission in our community.  Due to his co-morbid illnesses, this patient is at least at moderate risk for complications without adequate follow up.  This format is felt to be most appropriate for this patient at this time.  The patient did not have access to video technology/had technical difficulties with video requiring transitioning to audio format only (telephone).  All issues noted in this document were discussed and addressed.  No physical exam could be performed with this format.  Please refer to the patient's chart for his  consent to telehealth for [facility].   Evaluation Performed:  Telephone follow-up  Date:  12/07/2018   ID:  Kylie, Gros Feb 21, 1961, MRN [id number]  Patient Location:  [address] [place] [address]  Provider location:   [address], [address] [place], [place] [address]  PCP:  Lance Bosch, NP  Cardiologist:  Chrystie Nose, MD Electrophysiologist:  None   Chief Complaint:  No complaints  History of Present Illness:    Philip Thomas is a 58 y.o. male who presents via Web designer for a telehealth visit today.  Philip Thomas is seen today for telephone follow-up.  This is a 58 year old male with a history of hypertension and dyslipidemia and remote ST elevation MI in 2011, status post DES to the proximal RCA.  Unfortunately did not follow-up in 08/2013.  Recently was seen with complaints of unstable angina and sent for cardiac catheterization.  He was found to have a 95% mid RCA lesion which was treated with synergy drug-eluting stent.  He also had some mild to moderate nonobstructive coronary disease of the LAD and circumflex.  LV function was normal.  Since then he has had no further chest pain  symptoms.  He denies any worsening shortness of breath.  He reports compliance with aspirin and Brilinta.  He is on high potency atorvastatin 40 mg nightly.  He also has diabetes, type II, which was uncontrolled with hemoglobin A1c of 10, however recently his fasting blood sugars have been lower in the 110 mg/dL range.  Profile 2 weeks ago shows total cholesterol is improved from 196-97, triglycerides of 90, HDL 37 and LDL of 42.  Again he is also had about 30 pound weight loss.  He is made significant dietary changes including portion control and eating healthier.  He is also getting more exercise.  The patient does not have symptoms concerning for COVID-19 infection (fever, chills, cough, or new SHORTNESS OF BREATH).    Prior CV studies:   The following studies were reviewed today:  Lab work Chart reviewed  PMHx:  Past Medical History:  Diagnosis Date   CAD (coronary artery disease) 2011   DES to the proximal RCA   Dyslipidemia    GERD (gastroesophageal reflux disease)    Hypertension    NSTEMI (non-ST elevated myocardial infarction) (HCC) 2011   Tobacco dependence     Past Surgical History:  Procedure Laterality Date   CARDIAC CATHETERIZATION  01/28/2010   CORONARY STENT INTERVENTION N/A 08/30/2018   Procedure: CORONARY STENT INTERVENTION;  Surgeon: Swaziland, Peter M, MD;  Location: [facility] INVASIVE CV LAB;  Service: Cardiovascular;  Laterality: N/A;   HAND SURGERY     LEFT HAND   LEFT HEART CATH AND CORONARY ANGIOGRAPHY N/A 08/30/2018  Procedure: LEFT HEART CATH AND CORONARY ANGIOGRAPHY;  Surgeon: Swaziland, Peter M, MD;  Location: [facility] INVASIVE CV LAB;  Service: Cardiovascular;  Laterality: N/A;    FAMHx:  Family History  Problem Relation Age of Onset   Hypertension Mother    Stroke Mother    Diabetes Mother    Lung cancer Mother    Emphysema Father     SOCHx:   reports that he has been smoking. He has been smoking about 1.00 pack per day. He has never used  smokeless tobacco. He reports that he does not drink alcohol or use drugs.  ALLERGIES:  No Known Allergies  MEDS:  Current Meds  Medication Sig   acetaminophen (TYLENOL) 325 MG tablet Take 650 mg by mouth every 6 (six) hours as needed for moderate pain or headache.    aspirin 81 MG tablet Take 81 mg by mouth daily.     atorvastatin (LIPITOR) 40 MG tablet Take 1 tablet (40 mg total) by mouth daily.   esomeprazole (NEXIUM) 20 MG capsule Take 20 mg by mouth every evening.   glimepiride (AMARYL) 1 MG tablet Take 1 mg by mouth daily.   lisinopril (ZESTRIL) 2.5 MG tablet TAKE 1 TABLET BY MOUTH ONCE DAILY FOR HEART PROTECTION   metFORMIN (GLUCOPHAGE) 500 MG tablet TAKE 1 TABLET BY MOUTH ONCE DAILY INCREASE UP BY 1 TABLET EVERY WEEK TIL 2 TABLETS TWICE DAILY   metoprolol succinate (TOPROL XL) 25 MG 24 hr tablet Take 1 tablet (25 mg total) by mouth daily.   nitroGLYCERIN (NITROSTAT) 0.4 MG SL tablet Place 1 tablet (0.4 mg total) under the tongue every 5 (five) minutes as needed.   ticagrelor (BRILINTA) 90 MG TABS tablet Take 1 tablet (90 mg total) by mouth 2 (two) times daily.     ROS: Pertinent items noted in HPI and remainder of comprehensive ROS otherwise negative.  Labs/Other Tests and Data Reviewed:    Recent Labs: 08/27/2018: BUN 7; Creatinine, Ser 0.78; Potassium 4.1; Sodium 137 09/07/2018: Hemoglobin 15.1; Platelets 120 11/21/2018: ALT 29   Recent Lipid Panel Lab Results  Component Value Date/Time   CHOL 97 (L) 11/21/2018 08:16 AM   TRIG 90 11/21/2018 08:16 AM   HDL 37 (L) 11/21/2018 08:16 AM   CHOLHDL 2.6 11/21/2018 08:16 AM   CHOLHDL 5 12/30/2013 10:04 AM   LDLCALC 42 11/21/2018 08:16 AM   LDLDIRECT 69.2 09/17/2012 08:26 AM    Wt Readings from Last 3 Encounters:  12/07/18 166 lb (75.3 kg)  09/07/18 191 lb 9.6 oz (86.9 kg)  08/30/18 188 lb (85.3 kg)     Exam:    Vital Signs:  BP (!) 114/54    Ht 5\' 4"  (1.626 m)    Wt 166 lb (75.3 kg)    BMI 28.49 kg/m     Exam not performed due to telephone visit  ASSESSMENT & PLAN:    1. Coronary artery disease status post PCI to the RCA (08/2018) 2. History of ST elevation MI inferiorly 3. Hypertension 4. Dyslipidemia 5. Type 2 diabetes-uncontrolled  Philip Thomas has made significant progress after his recent MI with significant weight loss of about 30 pounds secondary to dietary changes.  He is now only mildly overweight.  His blood pressure is very well controlled and will need to monitor that with the weight loss to see if perhaps we have to discontinue medication.  Otherwise I would recommend remaining on low-dose lisinopril now due to renal protection and cardiovascular benefit as long as it is  tolerated.  His cholesterol is very well controlled.  He is hemoglobin A1c recently was 10.1 however his fasting blood sugars are now around 110.  I suspect his A1c is improved with dietary changes and weight loss.  I will defer to his PCP for recheck of his A1c which is scheduled in the next few weeks.  If he remains elevated, he may be a candidate to add Jardiance to his regimen.  Perhaps however his diabetes may have improved with aggressive dietary changes.  No further medication changes today.  He will remain on dual antiplatelet therapy at least until February 2021.  Plan follow-up with me in 6 months or sooner as necessary.  COVID-19 Education: The signs and symptoms of COVID-19 were discussed with the patient and how to seek care for testing (follow up with PCP or arrange E-visit).  The importance of social distancing was discussed today.  Patient Risk:   After full review of this patients clinical status, I feel that they are at least moderate risk at this time.  Time:   Today, I have spent 25 minutes with the patient with telehealth technology discussing hypertension, dyslipidemia, type 2 diabetes, coronary artery disease, weight loss and dietary recommendations.     Medication Adjustments/Labs and Tests  Ordered: Current medicines are reviewed at length with the patient today.  Concerns regarding medicines are outlined above.   Tests Ordered: No orders of the defined types were placed in this encounter.   Medication Changes: No orders of the defined types were placed in this encounter.   Disposition:  in 6 month(s)  Chrystie NoseKenneth C. Terin Dierolf, MD, Highlands Behavioral Health SystemFACC, FACP  [place]   [facility]  Medical Director of the Advanced Lipid Disorders &  Cardiovascular Risk Reduction Clinic Diplomate of the American Board of Clinical Lipidology Attending Cardiologist  Direct Dial: [phone]   Fax: [phone]  Website:  [email or web address]  Chrystie NoseKenneth C Serafin Decatur, MD  12/07/2018 8:39 AM

## 2019-08-16 ENCOUNTER — Other Ambulatory Visit: Payer: Self-pay | Admitting: Internal Medicine

## 2019-10-15 ENCOUNTER — Telehealth: Payer: Self-pay | Admitting: Cardiology

## 2019-10-15 NOTE — Telephone Encounter (Signed)
Spoke to patient's wife.Advised Dr.Hilty is husband's cardiologist.I will send message to him for advice.

## 2019-10-15 NOTE — Telephone Encounter (Signed)
Overdue for 1 year follow-up - may be able to come off Brilinta, but I need to see him first.  Dr Rexene Edison

## 2019-10-15 NOTE — Telephone Encounter (Signed)
New Message:   Pt's wife wants to know if Dr Swaziland want pt to continue taking his Brilinta?

## 2019-10-17 NOTE — Telephone Encounter (Signed)
Spoke with wife, explained that MD would like patient to be evaluated in office before stopping Brilinta. He has 2-3 days of med left from [employer] program. 16 days of samples provided to allow patient to take med until 4/19 @ 2:45pm appointment with MD  Lot: [id number] Exp: 03/2022

## 2019-10-28 ENCOUNTER — Ambulatory Visit (INDEPENDENT_AMBULATORY_CARE_PROVIDER_SITE_OTHER): Payer: Self-pay | Admitting: Internal Medicine

## 2019-10-28 ENCOUNTER — Encounter: Payer: Self-pay | Admitting: Internal Medicine

## 2019-10-28 ENCOUNTER — Other Ambulatory Visit: Payer: Self-pay

## 2019-10-28 VITALS — BP 130/72 | HR 67 | Temp 97.2°F | Ht 64.0 in | Wt 171.0 lb

## 2019-10-28 DIAGNOSIS — I2510 Atherosclerotic heart disease of native coronary artery without angina pectoris: Secondary | ICD-10-CM

## 2019-10-28 DIAGNOSIS — R0609 Other forms of dyspnea: Secondary | ICD-10-CM

## 2019-10-28 DIAGNOSIS — R0600 Dyspnea, unspecified: Secondary | ICD-10-CM

## 2019-10-28 DIAGNOSIS — E782 Mixed hyperlipidemia: Secondary | ICD-10-CM

## 2019-10-28 DIAGNOSIS — I10 Essential (primary) hypertension: Secondary | ICD-10-CM

## 2019-10-28 NOTE — Patient Instructions (Signed)
Medication Instructions:  Your physician recommends that you continue on your current medications as directed. Please refer to the Current Medication list given to you today.  *If you need a refill on your cardiac medications before your next appointment, please call your pharmacy*  Testing/Procedures: Lexiscan Myoview stress test   Follow-Up: At [facility], you and your health needs are our priority.  As part of our continuing mission to provide you with exceptional heart care, we have created designated Provider Care Teams.  These Care Teams include your primary Cardiologist (physician) and Advanced Practice Providers (APPs -  Physician Assistants and Nurse Practitioners) who all work together to provide you with the care you need, when you need it.  We recommend signing up for the patient portal called "MyChart".  Sign up information is provided on this After Visit Summary.  MyChart is used to connect with patients for Virtual Visits (Telemedicine).  Patients are able to view lab/test results, encounter notes, upcoming appointments, etc.  Non-urgent messages can be sent to your provider as well.   To learn more about what you can do with MyChart, go to [email or web address].    Your next appointment:   4 week(s)  The format for your next appointment:   In Person  Provider:   You may see Chrystie Nose, MD or one of the following Advanced Practice Providers on your designated Care Team:    Azalee Course, PA-C  Micah Flesher, PA-C or   Judy Pimple, [place]    Other Instructions

## 2019-10-29 ENCOUNTER — Telehealth (HOSPITAL_COMMUNITY): Payer: Self-pay | Admitting: *Deleted

## 2019-10-29 ENCOUNTER — Encounter (HOSPITAL_COMMUNITY): Payer: Self-pay | Admitting: *Deleted

## 2019-10-29 ENCOUNTER — Telehealth: Payer: Self-pay | Admitting: Licensed Clinical Social Worker

## 2019-10-29 ENCOUNTER — Encounter: Payer: Self-pay | Admitting: Internal Medicine

## 2019-10-29 NOTE — Telephone Encounter (Signed)
Sent patient instruction letter through My Chart for stress test scheduled for 11/01/19. Daneil Dolin

## 2019-10-29 NOTE — Telephone Encounter (Signed)
CSW received referral to assist patient with navigating insurance options. Patient is married and has income but no insurance. Wife reports he is currently receiving the [facility] Discount Plan but wondering if there are other options. CSW provided information on the AHA and wife reports they were previously on a plan through the [facility] but the premiums continued to go up and was no longer affordable. CSW encouraged to check again and see if there is anything current that might work for [employer]. Wife appreciative of the call and will return call if needed. Lasandra Beech, LCSW, CCSW-MCS [phone]

## 2019-10-29 NOTE — Progress Notes (Signed)
OFFICE NOTE  Chief Complaint:  Follow-up, dyspnea on exertion  Primary Care Physician: Lance Bosch, NP  HPI:  Philip Thomas is a 59 y.o. male with a past medial history significant for coronary artery disease with an STEMI in 2011 status post DES to the proximal RCA.  He also has dyslipidemia, hypertension and GERD as well as ongoing tobacco abuse.  He was last seen by Dr. Swaziland in 2015 at that time was not compliant with medications or smoking cessation.  He is therefore considered a new patient.  Since then he has had no further medical care.  He does not currently have insurance.  He is not taking any medications including aspirin on a regular basis.  Recently has been having worsening tightness in his chest and episodes of pain that are squeezing and associated with a pressure that radiates up to the jaw on both sides with occluded tightness in his throat.  Describes these episodes as intense, more 9-10 out of 10 in pain severity and they do improve eventually with rest however seem to be coming more frequently.  10/28/2019  Philip Thomas is seen today in follow-up.  Very 2020 underwent left heart catheterization and coronary angiography which showed 95% stenosis of the mid RCA status post DES.  There was some residual disease in the LAD and circumflex.  LVEF was normal.  The patient was recommended to be on Brilinta and aspirin for 1 year.  There was concern because he had lack of health insurance.  Ultimately has been paying for the medication however the cost of other medications has risen significantly.  Today is accompanied by his wife and overall seems to be doing well although recently has been reporting some shortness of breath.  This is particular with exertion and lifting objects.  He says it has been over the past several months and similar to his symptoms prior to his PCI in February 2020.  I was discussing with him the possibility of stopping Brilinta however I am concerned this  could be an anginal equivalent.  From a medical standpoint he is doing much better.  He is no longer on glimepiride, but takes Metformin 500 mg twice daily and hemoglobin A1c was 5.6.  Lipids show total cholesterol 97, triglycerides 90, HDL 37 and LDL of 42.  EKG today shows sinus rhythm at 67.  No new ischemic changes were noted.   PMHx:  Past Medical History:  Diagnosis Date  . CAD (coronary artery disease) 2011   DES to the proximal RCA  . Dyslipidemia   . GERD (gastroesophageal reflux disease)   . Hypertension   . NSTEMI (non-ST elevated myocardial infarction) (HCC) 2011  . Tobacco dependence     Past Surgical History:  Procedure Laterality Date  . CARDIAC CATHETERIZATION  01/28/2010  . CORONARY STENT INTERVENTION N/A 08/30/2018   Procedure: CORONARY STENT INTERVENTION;  Surgeon: Swaziland, Peter M, MD;  Location: [facility] INVASIVE CV LAB;  Service: Cardiovascular;  Laterality: N/A;  . HAND SURGERY     LEFT HAND  . LEFT HEART CATH AND CORONARY ANGIOGRAPHY N/A 08/30/2018   Procedure: LEFT HEART CATH AND CORONARY ANGIOGRAPHY;  Surgeon: Swaziland, Peter M, MD;  Location: [facility] INVASIVE CV LAB;  Service: Cardiovascular;  Laterality: N/A;    FAMHx:  Family History  Problem Relation Age of Onset  . Hypertension Mother   . Stroke Mother   . Diabetes Mother   . Lung cancer Mother   . Emphysema Father  SOCHx:   reports that he has been smoking. He has been smoking about 1.00 pack per day. He has never used smokeless tobacco. He reports that he does not drink alcohol or use drugs.  ALLERGIES:  No Known Allergies  ROS: Pertinent items noted in HPI and remainder of comprehensive ROS otherwise negative.  HOME MEDS: Current Outpatient Medications on File Prior to Visit  Medication Sig Dispense Refill  . acetaminophen (TYLENOL) 325 MG tablet Take 650 mg by mouth every 6 (six) hours as needed for moderate pain or headache.     Marland Kitchen aspirin 81 MG tablet Take 81 mg by mouth daily.      Marland Kitchen  atorvastatin (LIPITOR) 40 MG tablet Take 1 tablet by mouth once daily 90 tablet 0  . esomeprazole (NEXIUM) 20 MG capsule Take 20 mg by mouth every evening.    Marland Kitchen glimepiride (AMARYL) 1 MG tablet Take 1 mg by mouth daily.    Marland Kitchen lisinopril (ZESTRIL) 2.5 MG tablet TAKE 1 TABLET BY MOUTH ONCE DAILY FOR HEART PROTECTION    . metFORMIN (GLUCOPHAGE) 500 MG tablet TAKE 1 TABLET BY MOUTH ONCE DAILY INCREASE UP BY 1 TABLET EVERY WEEK TIL 2 TABLETS TWICE DAILY    . metoprolol succinate (TOPROL-XL) 25 MG 24 hr tablet Take 1 tablet by mouth once daily 90 tablet 0  . nitroGLYCERIN (NITROSTAT) 0.4 MG SL tablet Place 1 tablet (0.4 mg total) under the tongue every 5 (five) minutes as needed. 25 tablet 2  . ticagrelor (BRILINTA) 90 MG TABS tablet Take 1 tablet (90 mg total) by mouth 2 (two) times daily. 60 tablet 0   No current facility-administered medications on file prior to visit.    LABS/IMAGING: No results found for this or any previous visit (from the past 48 hour(s)). No results found.  LIPID PANEL:    Component Value Date/Time   CHOL 97 (L) 11/21/2018 0816   TRIG 90 11/21/2018 0816   HDL 37 (L) 11/21/2018 0816   CHOLHDL 2.6 11/21/2018 0816   CHOLHDL 5 12/30/2013 1004   VLDL 45.4 (H) 12/30/2013 1004   LDLCALC 42 11/21/2018 0816   LDLDIRECT 69.2 09/17/2012 0826     WEIGHTS: Wt Readings from Last 3 Encounters:  10/28/19 171 lb (77.6 kg)  12/07/18 166 lb (75.3 kg)  09/07/18 191 lb 9.6 oz (86.9 kg)    VITALS: BP 130/72   Pulse 67   Temp (!) 97.2 F (36.2 C)   Ht 5\' 4"  (1.626 m)   Wt 171 lb (77.6 kg)   SpO2 99%   BMI 29.35 kg/m   EXAM: General appearance: alert and no distress Neck: no carotid bruit, no JVD and thyroid not enlarged, symmetric, no tenderness/mass/nodules Lungs: clear to auscultation bilaterally Heart: regular rate and rhythm, S1, S2 normal, no murmur, click, rub or gallop Abdomen: soft, non-tender; bowel sounds normal; no masses,  no organomegaly Extremities:  extremities normal, atraumatic, no cyanosis or edema Pulses: 2+ and symmetric Skin: Skin color, texture, turgor normal. No rashes or lesions Neurologic: Grossly normal Psych: Pleasant  EKG: Normal sinus rhythm 67-- personally reviewed  ASSESSMENT: 1. Progressive dyspnea on exertion-?  Anginal equivalent 2. Dyslipidemia 3. Hypertension 4. CAD with prior an STEMI and drug-eluting stent to the RCA (2011) and recurrent coronary disease with PCI to the mid RCA (08/2018)  PLAN: 1.   Philip Thomas is again describing some dyspnea on exertion over the past couple months which is similar to his symptoms prior to the stent he had about  2 years ago.  I would not recommend discontinuing his Brilinta at this time until we further understand the cause of this.  I would recommend a Lexiscan Myoview stress test.  There is concern about the cost of the test however they said they may be able to swing the payments if possible.  We will have him discuss it further with our social worker/case manager to see what his options may be.  Follow-up with me afterwards.  Pixie Casino, MD, [facility], [place] Director of the Advanced Lipid Disorders &  Cardiovascular Risk Reduction Clinic Diplomate of the American Board of Clinical Lipidology Attending Cardiologist  Direct Dial: [phone]  Fax: [phone]  Website:  [email or web address]   Philip Thomas Philip Thomas 10/29/2019, 9:22 PM

## 2019-11-01 ENCOUNTER — Ambulatory Visit (HOSPITAL_COMMUNITY): Payer: Self-pay | Attending: Cardiology

## 2019-11-01 ENCOUNTER — Other Ambulatory Visit: Payer: Self-pay

## 2019-11-01 DIAGNOSIS — R0600 Dyspnea, unspecified: Secondary | ICD-10-CM | POA: Insufficient documentation

## 2019-11-01 DIAGNOSIS — I2510 Atherosclerotic heart disease of native coronary artery without angina pectoris: Secondary | ICD-10-CM | POA: Insufficient documentation

## 2019-11-01 DIAGNOSIS — R0609 Other forms of dyspnea: Secondary | ICD-10-CM

## 2019-11-01 LAB — MYOCARDIAL PERFUSION IMAGING
LV dias vol: 126 mL (ref 62–150)
LV sys vol: 58 mL
Peak HR: 75 {beats}/min
Rest HR: 57 {beats}/min
SDS: 0
SRS: 0
SSS: 0
TID: 1.06

## 2019-11-01 MED ORDER — REGADENOSON 0.4 MG/5ML IV SOLN
0.4000 mg | Freq: Once | INTRAVENOUS | Status: AC
  Administered 2019-11-01: 09:00:00 0.4 mg via INTRAVENOUS

## 2019-11-01 MED ORDER — TECHNETIUM TC 99M TETROFOSMIN IV KIT
10.4000 | PACK | Freq: Once | INTRAVENOUS | Status: AC | PRN
  Administered 2019-11-01: 10.4 via INTRAVENOUS
  Filled 2019-11-01: qty 11

## 2019-11-01 MED ORDER — TECHNETIUM TC 99M TETROFOSMIN IV KIT
30.0000 | PACK | Freq: Once | INTRAVENOUS | Status: AC | PRN
  Administered 2019-11-01: 30 via INTRAVENOUS
  Filled 2019-11-01: qty 30

## 2019-11-12 ENCOUNTER — Other Ambulatory Visit: Payer: Self-pay | Admitting: Internal Medicine

## 2019-11-29 ENCOUNTER — Other Ambulatory Visit: Payer: Self-pay

## 2019-11-29 ENCOUNTER — Ambulatory Visit (INDEPENDENT_AMBULATORY_CARE_PROVIDER_SITE_OTHER): Payer: Self-pay | Admitting: Internal Medicine

## 2019-11-29 ENCOUNTER — Encounter: Payer: Self-pay | Admitting: Internal Medicine

## 2019-11-29 VITALS — BP 124/68 | HR 61 | Ht 60.0 in | Wt 174.2 lb

## 2019-11-29 DIAGNOSIS — E782 Mixed hyperlipidemia: Secondary | ICD-10-CM

## 2019-11-29 DIAGNOSIS — I10 Essential (primary) hypertension: Secondary | ICD-10-CM

## 2019-11-29 DIAGNOSIS — R0600 Dyspnea, unspecified: Secondary | ICD-10-CM

## 2019-11-29 DIAGNOSIS — R0609 Other forms of dyspnea: Secondary | ICD-10-CM

## 2019-11-29 DIAGNOSIS — I2510 Atherosclerotic heart disease of native coronary artery without angina pectoris: Secondary | ICD-10-CM

## 2019-11-29 NOTE — Patient Instructions (Signed)
Medication Instructions:  The current medical regimen is effective;  continue present plan and medications.  *If you need a refill on your cardiac medications before your next appointment, please call your pharmacy*   Follow-Up: At [facility], you and your health needs are our priority.  As part of our continuing mission to provide you with exceptional heart care, we have created designated Provider Care Teams.  These Care Teams include your primary Cardiologist (physician) and Advanced Practice Providers (APPs -  Physician Assistants and Nurse Practitioners) who all work together to provide you with the care you need, when you need it.  We recommend signing up for the patient portal called "MyChart".  Sign up information is provided on this After Visit Summary.  MyChart is used to connect with patients for Virtual Visits (Telemedicine).  Patients are able to view lab/test results, encounter notes, upcoming appointments, etc.  Non-urgent messages can be sent to your provider as well.   To learn more about what you can do with MyChart, go to [email or web address].    Your next appointment:   6 month(s)  The format for your next appointment:   In Person  Provider:   K. Chad Hilty, MD      

## 2019-11-29 NOTE — Progress Notes (Signed)
OFFICE NOTE  Chief Complaint:  Follow-up  Primary Care Physician: Ferd Hibbs, NP  HPI:  Philip Thomas is a 59 y.o. male with a past medial history significant for coronary artery disease with an STEMI in 2011 status post DES to the proximal RCA.  He also has dyslipidemia, hypertension and GERD as well as ongoing tobacco abuse.  He was last seen by Dr. Martinique in 2015 at that time was not compliant with medications or smoking cessation.  He is therefore considered a new patient.  Since then he has had no further medical care.  He does not currently have insurance.  He is not taking any medications including aspirin on a regular basis.  Recently has been having worsening tightness in his chest and episodes of pain that are squeezing and associated with a pressure that radiates up to the jaw on both sides with occluded tightness in his throat.  Describes these episodes as intense, more 9-10 out of 10 in pain severity and they do improve eventually with rest however seem to be coming more frequently.  10/28/2019  Philip Thomas is seen today in follow-up.  Very 2020 underwent left heart catheterization and coronary angiography which showed 95% stenosis of the mid RCA status post DES.  There was some residual disease in the LAD and circumflex.  LVEF was normal.  The patient was recommended to be on Brilinta and aspirin for 1 year.  There was concern because he had lack of health insurance.  Ultimately has been paying for the medication however the cost of other medications has risen significantly.  Today is accompanied by his wife and overall seems to be doing well although recently has been reporting some shortness of breath.  This is particular with exertion and lifting objects.  He says it has been over the past several months and similar to his symptoms prior to his PCI in February 2020.  I was discussing with him the possibility of stopping Brilinta however I am concerned this could be an anginal  equivalent.  From a medical standpoint he is doing much better.  He is no longer on glimepiride, but takes Metformin 500 mg twice daily and hemoglobin A1c was 5.6.  Lipids show total cholesterol 97, triglycerides 90, HDL 37 and LDL of 42.  EKG today shows sinus rhythm at 67.  No new ischemic changes were noted.  12/01/2019  Philip Thomas returns today for follow-up.  He says that his dyspnea is somewhat better.  He did undergo nuclear stress testing which demonstrated an EF of 54% and a small area of inferior infarct.  No reversible ischemia was noted.  This is similar to findings in the past.  He had a known inferior infarct and prior stenting to the RCA.  Based on this finding I think he is at acceptable risk for any further procedures.  From a cardiac standpoint I advised him to discontinue his Brilinta and remain on aspirin monotherapy.  PMHx:  Past Medical History:  Diagnosis Date  . CAD (coronary artery disease) 2011   DES to the proximal RCA  . Dyslipidemia   . GERD (gastroesophageal reflux disease)   . Hypertension   . NSTEMI (non-ST elevated myocardial infarction) ([place]) 2011  . Tobacco dependence     Past Surgical History:  Procedure Laterality Date  . CARDIAC CATHETERIZATION  01/28/2010  . CORONARY STENT INTERVENTION N/A 08/30/2018   Procedure: CORONARY STENT INTERVENTION;  Surgeon: Martinique, Peter M, MD;  Location: [facility] INVASIVE CV  LAB;  Service: Cardiovascular;  Laterality: N/A;  . HAND SURGERY     LEFT HAND  . LEFT HEART CATH AND CORONARY ANGIOGRAPHY N/A 08/30/2018   Procedure: LEFT HEART CATH AND CORONARY ANGIOGRAPHY;  Surgeon: Swaziland, Peter M, MD;  Location[facility] INVASIVE CV LAB;  Service: Cardiovascular;  Laterality: N/A;    FAMHx:  Family History  Problem Relation Age of Onset  . Hypertension Mother   . Stroke Mother   . Diabetes Mother   . Lung cancer Mother   . Emphysema Father     SOCHx:   reports that he has been smoking. He has been smoking about 1.00 pack per day. He  has never used smokeless tobacco. He reports that he does not drink alcohol or use drugs.  ALLERGIES:  No Known Allergies  ROS: Pertinent items noted in HPI and remainder of comprehensive ROS otherwise negative.  HOME MEDS: Current Outpatient Medications on File Prior to Visit  Medication Sig Dispense Refill  . acetaminophen (TYLENOL) 325 MG tablet Take 650 mg by mouth every 6 (six) hours as needed for moderate pain or headache.     Marland Kitchen aspirin 81 MG tablet Take 81 mg by mouth daily.      Marland Kitchen atorvastatin (LIPITOR) 40 MG tablet Take 1 tablet (40 mg total) by mouth daily. 90 tablet 3  . esomeprazole (NEXIUM) 20 MG capsule Take 20 mg by mouth every evening.    Marland Kitchen glimepiride (AMARYL) 1 MG tablet Take 1 mg by mouth daily.    Marland Kitchen lisinopril (ZESTRIL) 2.5 MG tablet TAKE 1 TABLET BY MOUTH ONCE DAILY FOR HEART PROTECTION    . metFORMIN (GLUCOPHAGE) 500 MG tablet TAKE 1 TABLET BY MOUTH ONCE DAILY INCREASE UP BY 1 TABLET EVERY WEEK TIL 2 TABLETS TWICE DAILY    . metoprolol succinate (TOPROL-XL) 25 MG 24 hr tablet Take 1 tablet (25 mg total) by mouth daily. 90 tablet 3  . nitroGLYCERIN (NITROSTAT) 0.4 MG SL tablet Place 1 tablet (0.4 mg total) under the tongue every 5 (five) minutes as needed. 25 tablet 2   No current facility-administered medications on file prior to visit.    LABS/IMAGING: No results found for this or any previous visit (from the past 48 hour(s)). No results found.  LIPID PANEL:    Component Value Date/Time   CHOL 97 (L) 11/21/2018 0816   TRIG 90 11/21/2018 0816   HDL 37 (L) 11/21/2018 0816   CHOLHDL 2.6 11/21/2018 0816   CHOLHDL 5 12/30/2013 1004   VLDL 45.4 (H) 12/30/2013 1004   LDLCALC 42 11/21/2018 0816   LDLDIRECT 69.2 09/17/2012 0826     WEIGHTS: Wt Readings from Last 3 Encounters:  11/29/19 174 lb 3.2 oz (79 kg)  11/01/19 171 lb (77.6 kg)  10/28/19 171 lb (77.6 kg)    VITALS: BP 124/68   Pulse 61   Ht 5' (1.524 m)   Wt 174 lb 3.2 oz (79 kg)   SpO2 97%    BMI 34.02 kg/m   EXAM: Deferred  EKG: Deferred  ASSESSMENT: 1. Progressive dyspnea on exertion-low risk Myoview stress test, LVEF 54% with small area of inferior infarct (10/2019) 2. Dyslipidemia 3. Hypertension 4. CAD with prior an STEMI and drug-eluting stent to the RCA (2011) and recurrent coronary disease with PCI to the mid RCA (08/2018)  PLAN: 1.   Philip Thomas had a low risk Myoview stress test without ischemia and a small area of inferior infarct.  LVEF was 54%.  This is consistent with  his prior known infarct and RCA disease.  I advised him to discontinue Brilinta and remain on aspirin monotherapy.  His dyspnea has improved.    Follow-up with me in 6 months or sooner as necessary.  Chrystie Nose, MD, [facility], FACP  [place]  [facility]  Medical Director of the [facility]id [facility] &  Cardiovascular Risk Reduction Clinic Diplomate of the American Board of Clinical Lipidology Attending Cardiologist  Direct Dial: [phone]  Fax: [phone]  Website:  [email or web address] Phyliss Hulick 11/29/2019, 3:59 PM

## 2019-12-01 ENCOUNTER — Encounter: Payer: Self-pay | Admitting: Internal Medicine

## 2020-02-07 ENCOUNTER — Other Ambulatory Visit: Payer: Self-pay | Admitting: Physician Assistant

## 2020-02-07 DIAGNOSIS — D696 Thrombocytopenia, unspecified: Secondary | ICD-10-CM

## 2020-02-12 ENCOUNTER — Ambulatory Visit
Admission: RE | Admit: 2020-02-12 | Discharge: 2020-02-12 | Disposition: A | Discharge status: Home / Self Care | Payer: Self-pay | Source: Ambulatory Visit | Attending: Physician Assistant | Admitting: Physician Assistant

## 2020-02-12 DIAGNOSIS — D696 Thrombocytopenia, unspecified: Secondary | ICD-10-CM

## 2020-03-05 ENCOUNTER — Telehealth: Payer: Self-pay | Admitting: Hematology

## 2020-03-05 NOTE — Telephone Encounter (Signed)
Received a new hem referral from Celso Amy, [place] for thrombocytopenia. Pt has been cld and scheduled to see Dr. Candise Che on 9/15 at 11am. PT aware to arrive 15 minutes early.

## 2020-03-09 ENCOUNTER — Telehealth: Payer: Self-pay | Admitting: Hematology and Oncology

## 2020-03-09 NOTE — Telephone Encounter (Signed)
Philip Thomas has been rescheduled to see Dr. Pamelia Hoit on 9/14 at 315pm. Pt aware to arrive 15 minutes early.

## 2020-03-23 NOTE — Progress Notes (Signed)
[facility] CONSULT NOTE  Patient Care Team: Lance Bosch, NP as PCP - General (Nurse Practitioner) Chrystie Nose, MD as PCP - Cardiology (Cardiology)  CHIEF COMPLAINTS/PURPOSE OF CONSULTATION:  Newly diagnosed thrombocytopenia  HISTORY OF PRESENTING ILLNESS:  Philip Thomas 59 y.o. male is here because of recent diagnosis of thrombocytopenia. Labs on 02/05/20 showed Hg 12.3, HCT 40.1, platelets 89, and on 03/03/20 showed Hg 14.6, HCT 44.7, platelets 85. He presents to the clinic today for initial evaluation and discussion of treatment options.  He has had longstanding thrombocytopenia early as detected was in February 2020 when he was in the hospital with acute coronary syndrome and his platelets were 95.  He has not had prior blood work frequently before that.  He was noted to have blood in the stool and iron deficient and was given oral iron therapy and he progressively improved his hemoglobin up to 14.  He is still taking oral iron therapy.  He was supposed to undergo colonoscopy and endoscopy but the gastroenterology canceled those appointments and asked him to make an appointment to see [place].  He is accompanied today by his wife who was a previous patient of mine with a history of breast cancer.  I reviewed his records extensively and collaborated the history with the patient.  MEDICAL HISTORY:  Past Medical History:  Diagnosis Date  . CAD (coronary artery disease) 2011   DES to the proximal RCA  . Dyslipidemia   . GERD (gastroesophageal reflux disease)   . Hypertension   . NSTEMI (non-ST elevated myocardial infarction) ([facility]) 2011  . Tobacco dependence     SURGICAL HISTORY: Past Surgical History:  Procedure Laterality Date  . CARDIAC CATHETERIZATION  01/28/2010  . CORONARY STENT INTERVENTION N/A 08/30/2018   Procedure: CORONARY STENT INTERVENTION;  Surgeon: Swaziland, Peter M, MD;  Location: [facility] INVASIVE CV LAB;  Service: Cardiovascular;  Laterality: N/A;  . HAND  SURGERY     LEFT HAND  . LEFT HEART CATH AND CORONARY ANGIOGRAPHY N/A 08/30/2018   Procedure: LEFT HEART CATH AND CORONARY ANGIOGRAPHY;  Surgeon: Swaziland, Peter M, MD;  Location: [facility] INVASIVE CV LAB;  Service: Cardiovascular;  Laterality: N/A;    SOCIAL HISTORY: Social History   Socioeconomic History  . Marital status: Married    Spouse name: Not on file  . Number of children: 3  . Years of education: Not on file  . Highest education level: Not on file  Occupational History  . Occupation: roofer  Tobacco Use  . Smoking status: Current Every Day Smoker    Packs/day: 1.00  . Smokeless tobacco: Never Used  Substance and Sexual Activity  . Alcohol use: No  . Drug use: No  . Sexual activity: Yes  Other Topics Concern  . Not on file  Social History Narrative  . Not on file   Social Determinants of Health   Financial Resource Strain:   . Difficulty of Paying Living Expenses: Not on file  Food Insecurity:   . Worried About Programme researcher, broadcasting/film/video in the Last Year: Not on file  . Ran Out of Food in the Last Year: Not on file  Transportation Needs:   . Lack of Transportation (Medical): Not on file  . Lack of Transportation (Non-Medical): Not on file  Physical Activity:   . Days of Exercise per Week: Not on file  . Minutes of Exercise per Session: Not on file  Stress:   . Feeling of Stress : Not on file  Social Connections:   . Frequency of Communication with Friends and Family: Not on file  . Frequency of Social Gatherings with Friends and Family: Not on file  . Attends Religious Services: Not on file  . Active Member of Clubs or Organizations: Not on file  . Attends Banker Meetings: Not on file  . Marital Status: Not on file  Intimate Partner Violence:   . Fear of Current or Ex-Partner: Not on file  . Emotionally Abused: Not on file  . Physically Abused: Not on file  . Sexually Abused: Not on file    FAMILY HISTORY: Family History  Problem Relation Age of  Onset  . Hypertension Mother   . Stroke Mother   . Diabetes Mother   . Lung cancer Mother   . Emphysema Father     ALLERGIES:  has No Known Allergies.  MEDICATIONS:  Current Outpatient Medications  Medication Sig Dispense Refill  . acetaminophen (TYLENOL) 325 MG tablet Take 650 mg by mouth every 6 (six) hours as needed for moderate pain or headache.     Marland Kitchen aspirin 81 MG tablet Take 81 mg by mouth daily.      Marland Kitchen atorvastatin (LIPITOR) 40 MG tablet Take 1 tablet (40 mg total) by mouth daily. 90 tablet 3  . esomeprazole (NEXIUM) 20 MG capsule Take 20 mg by mouth every evening.    Marland Kitchen glimepiride (AMARYL) 1 MG tablet Take 1 mg by mouth daily.    Marland Kitchen lisinopril (ZESTRIL) 2.5 MG tablet TAKE 1 TABLET BY MOUTH ONCE DAILY FOR HEART PROTECTION    . metFORMIN (GLUCOPHAGE) 500 MG tablet TAKE 1 TABLET BY MOUTH ONCE DAILY INCREASE UP BY 1 TABLET EVERY WEEK TIL 2 TABLETS TWICE DAILY    . metoprolol succinate (TOPROL-XL) 25 MG 24 hr tablet Take 1 tablet (25 mg total) by mouth daily. 90 tablet 3  . nitroGLYCERIN (NITROSTAT) 0.4 MG SL tablet Place 1 tablet (0.4 mg total) under the tongue every 5 (five) minutes as needed. 25 tablet 2   No current facility-administered medications for this visit.          PHYSICAL EXAMINATION: ECOG PERFORMANCE STATUS: 1 - Symptomatic but completely ambulatory  Vitals:   03/24/20 1505  BP: 136/72  Pulse: (!) 56  Resp: 16  Temp: (!) 97.1 F (36.2 C)  SpO2: 98%   Filed Weights   03/24/20 1505  Weight: 178 lb 14.4 oz (81.1 kg)       LABORATORY DATA:  I have reviewed the data as listed Lab Results  Component Value Date   WBC 6.3 09/07/2018   HGB 15.1 09/07/2018   HCT 43.9 09/07/2018   MCV 96 09/07/2018   PLT 120 (L) 09/07/2018   Lab Results  Component Value Date   NA 137 08/27/2018   K 4.1 08/27/2018   CL 100 08/27/2018   CO2 21 08/27/2018    RADIOGRAPHIC STUDIES: I have personally reviewed the radiological reports and agreed with the findings in  the report.  ASSESSMENT AND PLAN:  Thrombocytopenia ([facility]) 08/27/2018: Platelet count 97 09/07/2018: Platelet count 120 02/05/20 showed Hg 12.3, HCT 40.1, platelets 89 03/03/20 showed Hg 14.6, HCT 44.7, platelets 85  Differential diagnosis: 1. Low-grade ITP 2. medication induced: On further review patient is not taking any medications that are associated with thrombocytopenia 3. Bone marrow factors: Unlikely because the rest of his blood parameters are normal 4. Hepatitis B/C and HIV 5. Splenomegaly: Recent abdominal ultrasound revealed splenomegaly with fatty liver and cirrhosis  findings.  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   I discussed with the patient that the level of thrombocytopenia is very mild and that these levels, there are usually no adverse effects.   History of iron deficiency anemia: We will check iron studies today.  His iron deficiency anemia could also be related to gastric variceal bleed and therefore he would benefit from an upper endoscopy and colonoscopy.   1 week follow-up to discuss results of the blood test today.    All questions were answered. The patient knows to call the clinic with any problems, questions or concerns.   Sabas Sous, MD, MPH 03/24/2020    I, Molly Dorshimer, am acting as scribe for Serena Croissant, MD.  I have reviewed the above documentation for accuracy and completeness, and I agree with the above.

## 2020-03-24 ENCOUNTER — Telehealth: Payer: Self-pay | Admitting: Hematology and Oncology

## 2020-03-24 ENCOUNTER — Inpatient Hospital Stay: Payer: Self-pay

## 2020-03-24 ENCOUNTER — Other Ambulatory Visit: Payer: Self-pay

## 2020-03-24 ENCOUNTER — Inpatient Hospital Stay: Payer: Self-pay | Attending: Hematology | Admitting: Hematology and Oncology

## 2020-03-24 VITALS — BP 136/72 | HR 56 | Temp 97.1°F | Resp 16 | Ht 60.0 in | Wt 178.9 lb

## 2020-03-24 DIAGNOSIS — D696 Thrombocytopenia, unspecified: Secondary | ICD-10-CM | POA: Insufficient documentation

## 2020-03-24 DIAGNOSIS — D500 Iron deficiency anemia secondary to blood loss (chronic): Secondary | ICD-10-CM

## 2020-03-24 DIAGNOSIS — F17210 Nicotine dependence, cigarettes, uncomplicated: Secondary | ICD-10-CM | POA: Insufficient documentation

## 2020-03-24 DIAGNOSIS — Z801 Family history of malignant neoplasm of trachea, bronchus and lung: Secondary | ICD-10-CM | POA: Insufficient documentation

## 2020-03-24 LAB — HEPATITIS C ANTIBODY: HCV Ab: NONREACTIVE

## 2020-03-24 LAB — CBC WITH DIFFERENTIAL (CANCER CENTER ONLY)
Abs Immature Granulocytes: 0.01 10*3/uL (ref 0.00–0.07)
Basophils Absolute: 0 10*3/uL (ref 0.0–0.1)
Basophils Relative: 0 %
Eosinophils Absolute: 0.1 10*3/uL (ref 0.0–0.5)
Eosinophils Relative: 3 %
HCT: 42.6 % (ref 39.0–52.0)
Hemoglobin: 14 g/dL (ref 13.0–17.0)
Immature Granulocytes: 0 %
Lymphocytes Relative: 25 %
Lymphs Abs: 1.2 10*3/uL (ref 0.7–4.0)
MCH: 28.2 pg (ref 26.0–34.0)
MCHC: 32.9 g/dL (ref 30.0–36.0)
MCV: 85.9 fL (ref 80.0–100.0)
Monocytes Absolute: 0.5 10*3/uL (ref 0.1–1.0)
Monocytes Relative: 11 %
Neutro Abs: 2.8 10*3/uL (ref 1.7–7.7)
Neutrophils Relative %: 61 %
Platelet Count: 89 10*3/uL — ABNORMAL LOW (ref 150–400)
RBC: 4.96 MIL/uL (ref 4.22–5.81)
RDW: 21 % — ABNORMAL HIGH (ref 11.5–15.5)
WBC Count: 4.7 10*3/uL (ref 4.0–10.5)
nRBC: 0 % (ref 0.0–0.2)

## 2020-03-24 LAB — CMP (CANCER CENTER ONLY)
ALT: 29 U/L (ref 0–44)
AST: 28 U/L (ref 15–41)
Albumin: 3.7 g/dL (ref 3.5–5.0)
Alkaline Phosphatase: 69 U/L (ref 38–126)
Anion gap: 5 (ref 5–15)
BUN: 12 mg/dL (ref 8–23)
CO2: 24 mmol/L (ref 22–32)
Calcium: 9.4 mg/dL (ref 8.9–10.3)
Chloride: 109 mmol/L (ref 98–111)
Creatinine: 0.79 mg/dL (ref 0.61–1.24)
GFR, Est AFR Am: >60 mL/min (ref >60)
GFR, Estimated: >60 mL/min (ref >60)
Glucose, Bld: 88 mg/dL (ref 70–99)
Potassium: 4 mmol/L (ref 3.5–5.1)
Sodium: 138 mmol/L (ref 135–145)
Total Bilirubin: 0.6 mg/dL (ref 0.3–1.2)
Total Protein: 7.3 g/dL (ref 6.5–8.1)

## 2020-03-24 LAB — HEPATITIS B SURFACE ANTIGEN: Hepatitis B Surface Ag: NONREACTIVE

## 2020-03-24 LAB — HIV ANTIBODY (ROUTINE TESTING W REFLEX): HIV Screen 4th Generation wRfx: NONREACTIVE

## 2020-03-24 LAB — PLATELET BY CITRATE

## 2020-03-24 LAB — VITAMIN B12: Vitamin B-12: 284 pg/mL (ref 180–914)

## 2020-03-24 NOTE — Telephone Encounter (Signed)
Scheduled appts per 9/14 los. Gave pt a print out of AVS.  

## 2020-03-24 NOTE — Assessment & Plan Note (Signed)
08/27/2018: Platelet count 97 09/07/2018: Platelet count 120 02/05/20 showed Hg 12.3, HCT 40.1, platelets 89 03/03/20 showed Hg 14.6, HCT 44.7, platelets 85  Differential diagnosis: 1. Low-grade ITP 2. medication induced: On further review patient is not taking any medications that are associated with thrombocytopenia 3. Bone marrow factors 4. Hepatitis B/C 5. Splenomegaly: No enlarged spleen was palpable to physical exam.  I discussed with the patient that the level of thrombocytopenia is very mild and that these levels, there are usually no adverse effects. There is usually no risk of bleeding and hence it can be observed without making any changes to patient's medications or requiring any further investigations like bone marrow biopsies.  I recommended watchful monitoring. We can see the patient back in one year with labs and follow-up.

## 2020-03-25 ENCOUNTER — Encounter: Payer: Self-pay | Admitting: Hematology

## 2020-03-25 LAB — FERRITIN: Ferritin: 35 ng/mL (ref 24–336)

## 2020-03-25 LAB — IRON AND TIBC
Iron: 80 ug/dL (ref 42–163)
Saturation Ratios: 21 % (ref 20–55)
TIBC: 378 ug/dL (ref 202–409)
UIBC: 298 ug/dL (ref 117–376)

## 2020-03-30 NOTE — Progress Notes (Signed)
  HEMATOLOGY-ONCOLOGY TELEPHONE VISIT PROGRESS NOTE  I connected with Philip Thomas on 03/31/2020 at  2:30 PM EDT by telephone and verified that I am speaking with the correct person using two identifiers.  I discussed the limitations, risks, security and privacy concerns of performing an evaluation and management service by telephone and the availability of in person appointments.  I also discussed with the patient that there may be a patient responsible charge related to this service. The patient expressed understanding and agreed to proceed.   History of Present Illness: Philip Thomas is a 59 y.o. male with above-mentioned history of thrombocytopenia and iron deficiency anemia. Labs on 03/24/20 showed Hg 14.0, HCT 42.6, platelets 89, iron saturation 21%, ferritin 35, B-12 284. She presents over the phone today to discuss his lab results.   Observations/Objective:     Assessment Plan:  Thrombocytopenia ([place]) 08/27/2018: Platelet count 97 09/07/2018: Platelet count 120 02/05/20 showed Hg 12.3, HCT 40.1, platelets 89 03/03/20 showed Hg 14.6, HCT 44.7, platelets 85 03/24/2020: Platelet count 89 Hepatitis B, C, HIV: Negative, B12: 284 Abdominal ultrasound: Fatty liver with splenomegaly  I discussed with the patient the most likely cause of the thrombocytopenia is mild low-grade autoimmune thrombocytopenia versus splenomegaly secondary to fatty liver . His platelet count is relatively stable and not need any immediate therapy for it.   I recommended that he exercise 30 minutes daily and lose 15 pounds in 6 months.  If his platelets drop below 50 then we will need to perform a bone marrow biopsy and consider treatment for thrombocytopenia.     I discussed the assessment and treatment plan with the patient. The patient was provided an opportunity to ask questions and all were answered. The patient agreed with the plan and demonstrated an understanding of the instructions. The patient was  advised to call back or seek an in-person evaluation if the symptoms worsen or if the condition fails to improve as anticipated.   I provided 15 minutes of non-face-to-face time during this encounter.   Rulon Eisenmenger, MD 03/31/2020    I, Molly Dorshimer, am acting as scribe for Nicholas Lose, MD.  I have reviewed the above documentation for accuracy and completeness, and I agree with the above.

## 2020-03-31 ENCOUNTER — Inpatient Hospital Stay (HOSPITAL_BASED_OUTPATIENT_CLINIC_OR_DEPARTMENT_OTHER): Payer: Self-pay | Admitting: Hematology and Oncology

## 2020-03-31 DIAGNOSIS — D696 Thrombocytopenia, unspecified: Secondary | ICD-10-CM

## 2020-03-31 NOTE — Assessment & Plan Note (Signed)
08/27/2018: Platelet count 97 09/07/2018: Platelet count 120 02/05/20 showed Hg 12.3, HCT 40.1, platelets 89 03/03/20 showed Hg 14.6, HCT 44.7, platelets 85 03/24/2020: Platelet count 89 Hepatitis B, C, HIV: Negative, B12: 284  I discussed with the patient the most likely cause of the thrombocytopenia is mild low-grade autoimmune thrombocytopenia. His platelet count is relatively stable and not need any immediate therapy for it.    If his platelets drop below 50 then we will need to perform a bone marrow biopsy and consider treatment for thrombocytopenia.

## 2020-06-08 ENCOUNTER — Encounter: Payer: Self-pay | Admitting: Internal Medicine

## 2020-06-08 ENCOUNTER — Other Ambulatory Visit: Payer: Self-pay

## 2020-06-08 ENCOUNTER — Ambulatory Visit (INDEPENDENT_AMBULATORY_CARE_PROVIDER_SITE_OTHER): Payer: Self-pay | Admitting: Internal Medicine

## 2020-06-08 VITALS — BP 134/70 | HR 62 | Ht 64.0 in | Wt 182.0 lb

## 2020-06-08 DIAGNOSIS — I10 Essential (primary) hypertension: Secondary | ICD-10-CM

## 2020-06-08 DIAGNOSIS — E782 Mixed hyperlipidemia: Secondary | ICD-10-CM

## 2020-06-08 DIAGNOSIS — I2510 Atherosclerotic heart disease of native coronary artery without angina pectoris: Secondary | ICD-10-CM

## 2020-06-08 DIAGNOSIS — M791 Myalgia, unspecified site: Secondary | ICD-10-CM

## 2020-06-08 NOTE — Progress Notes (Signed)
OFFICE NOTE  Chief Complaint:  Follow-up  Primary Care Physician: Lance Bosch, NP  HPI:  Philip Thomas is a 59 y.o. male with a past medial history significant for coronary artery disease with an STEMI in 2011 status post DES to the proximal RCA.  He also has dyslipidemia, hypertension and GERD as well as ongoing tobacco abuse.  He was last seen by Dr. Swaziland in 2015 at that time was not compliant with medications or smoking cessation.  He is therefore considered a new patient.  Since then he has had no further medical care.  He does not currently have insurance.  He is not taking any medications including aspirin on a regular basis.  Recently has been having worsening tightness in his chest and episodes of pain that are squeezing and associated with a pressure that radiates up to the jaw on both sides with occluded tightness in his throat.  Describes these episodes as intense, more 9-10 out of 10 in pain severity and they do improve eventually with rest however seem to be coming more frequently.  10/28/2019  Philip Thomas is seen today in follow-up.  Very 2020 underwent left heart catheterization and coronary angiography which showed 95% stenosis of the mid RCA status post DES.  There was some residual disease in the LAD and circumflex.  LVEF was normal.  The patient was recommended to be on Brilinta and aspirin for 1 year.  There was concern because he had lack of health insurance.  Ultimately has been paying for the medication however the cost of other medications has risen significantly.  Today is accompanied by his wife and overall seems to be doing well although recently has been reporting some shortness of breath.  This is particular with exertion and lifting objects.  He says it has been over the past several months and similar to his symptoms prior to his PCI in February 2020.  I was discussing with him the possibility of stopping Brilinta however I am concerned this could be an anginal  equivalent.  From a medical standpoint he is doing much better.  He is no longer on glimepiride, but takes Metformin 500 mg twice daily and hemoglobin A1c was 5.6.  Lipids show total cholesterol 97, triglycerides 90, HDL 37 and LDL of 42.  EKG today shows sinus rhythm at 67.  No new ischemic changes were noted.  12/01/2019  Philip Thomas returns today for follow-up.  He says that his dyspnea is somewhat better.  He did undergo nuclear stress testing which demonstrated an EF of 54% and a small area of inferior infarct.  No reversible ischemia was noted.  This is similar to findings in the past.  He had a known inferior infarct and prior stenting to the RCA.  Based on this finding I think he is at acceptable risk for any further procedures.  From a cardiac standpoint I advised him to discontinue his Brilinta and remain on aspirin monotherapy.  06/08/2020  Philip Thomas returns today for follow-up .  Overall he says he is doing really well.  He denies any chest pain or worsening shortness of breath.  He has good blood pressure control today.  EKG shows a normal sinus rhythm.  He has had excellent control over his lipids in the past but is due for repeat lipid profile soon.  His only concern is that he is describing some soreness across his upper chest and upper back as well as his arms bilaterally.  He says  is fairly constant and feels like a muscle ache.  He says it is almost like he is worked out too much at [employer] and has been sore for quite a while.  PMHx:  Past Medical History:  Diagnosis Date   CAD (coronary artery disease) 2011   DES to the proximal RCA   Dyslipidemia    GERD (gastroesophageal reflux disease)    Hypertension    NSTEMI (non-ST elevated myocardial infarction) (HCC) 2011   Tobacco dependence     Past Surgical History:  Procedure Laterality Date   CARDIAC CATHETERIZATION  01/28/2010   CORONARY STENT INTERVENTION N/A 08/30/2018   Procedure: CORONARY STENT INTERVENTION;   Surgeon: Swaziland, Peter M, MD;  Location: [facility] INVASIVE CV LAB;  Service: Cardiovascular;  Laterality: N/A;   HAND SURGERY     LEFT HAND   LEFT HEART CATH AND CORONARY ANGIOGRAPHY N/A 08/30/2018   Procedure: LEFT HEART CATH AND CORONARY ANGIOGRAPHY;  Surgeon: Swaziland, Peter M, MD;  Location: [facility] INVASIVE CV LAB;  Service: Cardiovascular;  Laterality: N/A;    FAMHx:  Family History  Problem Relation Age of Onset   Hypertension Mother    Stroke Mother    Diabetes Mother    Lung cancer Mother    Emphysema Father     SOCHx:   reports that he has been smoking. He has been smoking about 1.00 pack per day. He has never used smokeless tobacco. He reports that he does not drink alcohol and does not use drugs.  ALLERGIES:  No Known Allergies  ROS: Pertinent items noted in HPI and remainder of comprehensive ROS otherwise negative.  HOME MEDS: Current Outpatient Medications on File Prior to Visit  Medication Sig Dispense Refill   acetaminophen (TYLENOL) 325 MG tablet Take 650 mg by mouth every 6 (six) hours as needed for moderate pain or headache.      aspirin 81 MG tablet Take 81 mg by mouth daily.       atorvastatin (LIPITOR) 40 MG tablet Take 1 tablet (40 mg total) by mouth daily. 90 tablet 3   esomeprazole (NEXIUM) 20 MG capsule Take 20 mg by mouth every evening.     metFORMIN (GLUCOPHAGE-XR) 500 MG 24 hr tablet Take 500 mg by mouth daily with breakfast. 1 Tablet Daily with Breakfast     metoprolol succinate (TOPROL-XL) 25 MG 24 hr tablet Take 1 tablet (25 mg total) by mouth daily. 90 tablet 3   nitroGLYCERIN (NITROSTAT) 0.4 MG SL tablet Place 1 tablet (0.4 mg total) under the tongue every 5 (five) minutes as needed. 25 tablet 2   No current facility-administered medications on file prior to visit.    LABS/IMAGING: No results found for this or any previous visit (from the past 48 hour(s)). No results found.  LIPID PANEL:    Component Value Date/Time   CHOL 97 (L)  11/21/2018 0816   TRIG 90 11/21/2018 0816   HDL 37 (L) 11/21/2018 0816   CHOLHDL 2.6 11/21/2018 0816   CHOLHDL 5 12/30/2013 1004   VLDL 45.4 (H) 12/30/2013 1004   LDLCALC 42 11/21/2018 0816   LDLDIRECT 69.2 09/17/2012 0826     WEIGHTS: Wt Readings from Last 3 Encounters:  06/08/20 182 lb (82.6 kg)  03/24/20 178 lb 14.4 oz (81.1 kg)  11/29/19 174 lb 3.2 oz (79 kg)    VITALS: BP 134/70    Pulse 62    Ht 5\' 4"  (1.626 m)    Wt 182 lb (82.6 kg)  SpO2 95%    BMI 31.24 kg/m   EXAM: General appearance: alert and no distress Neck: no carotid bruit, no JVD and thyroid not enlarged, symmetric, no tenderness/mass/nodules Lungs: clear to auscultation bilaterally Heart: regular rate and rhythm, S1, S2 normal, no murmur, click, rub or gallop Abdomen: soft, non-tender; bowel sounds normal; no masses,  no organomegaly Extremities: extremities normal, atraumatic, no cyanosis or edema Pulses: 2+ and symmetric Skin: Skin color, texture, turgor normal. No rashes or lesions Neurologic: Grossly normal Psych: Pleasant  EKG: Normal sinus rhythm at 62 - personally reviewed  ASSESSMENT: 1. Diffuse muscle soreness, ?statin myalgia 2. Progressive dyspnea on exertion-low risk Myoview stress test, LVEF 54% with small area of inferior infarct (10/2019) 3. Dyslipidemia 4. Hypertension 5. CAD with prior an STEMI and drug-eluting stent to the RCA (2011) and recurrent coronary disease with PCI to the mid RCA (08/2018)  PLAN: 1.   Philip Thomas is describing diffuse upper body muscle soreness that feels like he is over exercise.  This could be a statin myalgia.  I advised him to stop the atorvastatin for up to 2 weeks to see if his symptoms improve.  If so we will need to consider switching him to an alternative.  From a cardiac standpoint does not sound like he is having any anginal symptoms.  His cholesterol is very well treated.  Blood pressure is at goal.  If we do switch his medicines then I would recommend  a repeat lipid profile in about 3 months.  Follow-up with me otherwise annually or sooner as necessary.  Chrystie Nose, MD, [facility], FACP  [place]   [facility]  Medical Director of the [facility] Lip[facility] &  Cardiovascular Risk Reduction Clinic Diplomate of the American Board of Clinical Lipidology Attending Cardiologist  Direct Dial: [phone]   Fax: [phone]  Website:  [email or web address] 06/08/2020, 4:22 PM

## 2020-06-08 NOTE — Patient Instructions (Signed)
Medication Instructions:  HOLD statin medication for 2 weeks and call with update on how your symptoms are  *If you need a refill on your cardiac medications before your next appointment, please call your pharmacy*   Follow-Up: At [facility], you and your health needs are our priority.  As part of our continuing mission to provide you with exceptional heart care, we have created designated Provider Care Teams.  These Care Teams include your primary Cardiologist (physician) and Advanced Practice Providers (APPs -  Physician Assistants and Nurse Practitioners) who all work together to provide you with the care you need, when you need it.  We recommend signing up for the patient portal called "MyChart".  Sign up information is provided on this After Visit Summary.  MyChart is used to connect with patients for Virtual Visits (Telemedicine).  Patients are able to view lab/test results, encounter notes, upcoming appointments, etc.  Non-urgent messages can be sent to your provider as well.   To learn more about what you can do with MyChart, go to [email or web address].    Your next appointment:   12 month(s)  The format for your next appointment:   In Person  Provider:   You may see Chrystie Nose, MD or one of the following Advanced Practice Providers on your designated Care Team:    Azalee Course, PA-C  Micah Flesher, PA-C or   Judy Pimple, [place]    Other Instructions

## 2020-06-10 NOTE — Addendum Note (Signed)
Addended by: Myna Hidalgo A on: 06/10/2020 03:26 PM   Modules accepted: Orders

## 2020-06-23 DIAGNOSIS — E782 Mixed hyperlipidemia: Secondary | ICD-10-CM

## 2020-06-24 MED ORDER — ROSUVASTATIN CALCIUM 20 MG PO TABS: 20.0000 mg | ORAL_TABLET | Freq: Every day | ORAL | 3 refills | Status: DC

## 2020-09-27 NOTE — Progress Notes (Incomplete)
   Patient Care Team: Lance Bosch, NP as PCP - General (Nurse Practitioner) Chrystie Nose, MD as PCP - Cardiology (Cardiology)  DIAGNOSIS: No diagnosis found.  CHIEF COMPLIANT: Follow-up of thrombocytopenia and iron deficiency anemia  INTERVAL HISTORY: Philip Thomas is a 60 y.o. with above-mentioned history of thrombocytopenia and iron deficiency anemia. He presents to the clinic today for follow-up.   ALLERGIES:  has No Known Allergies.  MEDICATIONS:  Current Outpatient Medications  Medication Sig Dispense Refill  . acetaminophen (TYLENOL) 325 MG tablet Take 650 mg by mouth every 6 (six) hours as needed for moderate pain or headache.     Marland Kitchen aspirin 81 MG tablet Take 81 mg by mouth daily.      Marland Kitchen atorvastatin (LIPITOR) 40 MG tablet Take 1 tablet (40 mg total) by mouth daily. 90 tablet 3  . esomeprazole (NEXIUM) 20 MG capsule Take 20 mg by mouth every evening.    . metFORMIN (GLUCOPHAGE-XR) 500 MG 24 hr tablet Take 500 mg by mouth daily with breakfast. 1 Tablet Daily with Breakfast    . metoprolol succinate (TOPROL-XL) 25 MG 24 hr tablet Take 1 tablet (25 mg total) by mouth daily. 90 tablet 3  . nitroGLYCERIN (NITROSTAT) 0.4 MG SL tablet Place 1 tablet (0.4 mg total) under the tongue every 5 (five) minutes as needed. 25 tablet 2  . rosuvastatin (CRESTOR) 20 MG tablet Take 1 tablet (20 mg total) by mouth daily. 90 tablet 3   No current facility-administered medications for this visit.    PHYSICAL EXAMINATION: ECOG PERFORMANCE STATUS: {CHL ONC ECOG [phone]}  There were no vitals filed for this visit. There were no vitals filed for this visit.   LABORATORY DATA:  I have reviewed the data as listed CMP Latest Ref Rng & Units 03/24/2020 11/21/2018 08/27/2018  Glucose 70 - 99 mg/dL 88 - 132(G)  BUN 8 - 23 mg/dL 12 - 7  Creatinine 4.01 - 1.24 mg/dL 0.27 - 2.53  Sodium 664 - 145 mmol/L 138 - 137  Potassium 3.5 - 5.1 mmol/L 4.0 - 4.1  Chloride 98 - 111 mmol/L 109 - 100  CO2  22 - 32 mmol/L 24 - 21  Calcium 8.9 - 10.3 mg/dL 9.4 - 9.5  Total Protein 6.5 - 8.1 g/dL 7.3 6.8 -  Total Bilirubin 0.3 - 1.2 mg/dL 0.6 0.8 -  Alkaline Phos 38 - 126 U/L 69 87 -  AST 15 - 41 U/L 28 28 -  ALT 0 - 44 U/L 29 29 -    Lab Results  Component Value Date   WBC 4.7 03/24/2020   HGB 14.0 03/24/2020   HCT 42.6 03/24/2020   MCV 85.9 03/24/2020   PLT 89 (L) 03/24/2020   NEUTROABS 2.8 03/24/2020    ASSESSMENT & PLAN:  No problem-specific Assessment & Plan notes found for this encounter.    No orders of the defined types were placed in this encounter.  The patient has a good understanding of the overall plan. he agrees with it. he will call with any problems that may develop before the next visit here.  Total time spent: *** mins including face to face time and time spent for planning, charting and coordination of care  Sabas Sous, MD, MPH 09/27/2020  I, Molly Dorshimer, am acting as scribe for Dr. Serena Croissant.  {insert scribe attestation}

## 2020-09-28 ENCOUNTER — Inpatient Hospital Stay: Payer: Self-pay | Attending: Hematology and Oncology | Admitting: Hematology and Oncology

## 2020-09-28 ENCOUNTER — Inpatient Hospital Stay: Payer: Self-pay

## 2020-09-28 NOTE — Assessment & Plan Note (Deleted)
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   I recommended that Philip Thomas exercise 30 minutes daily and lose 15 pounds in 6 months.

## 2020-11-24 ENCOUNTER — Other Ambulatory Visit: Payer: Self-pay | Admitting: Internal Medicine

## 2021-01-20 IMAGING — US US ABDOMEN COMPLETE
1 series · 13 of 25 positions shown · non-contrast
Comparison: None.

CLINICAL DATA: 59-year-old male with thrombocytopenia.

EXAM:
ABDOMEN ULTRASOUND COMPLETE

[Series 1: us abdomen complete · 0.28mm/px · 13 of 90 slices shown]
[im 1/90]
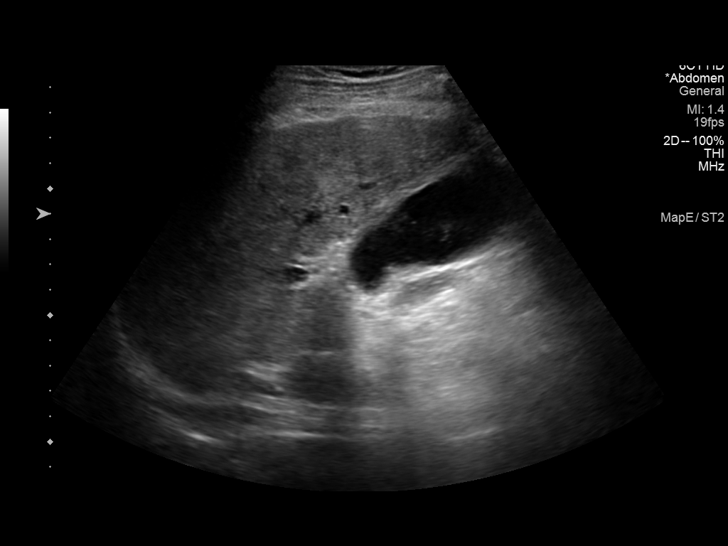
[im 8/90]
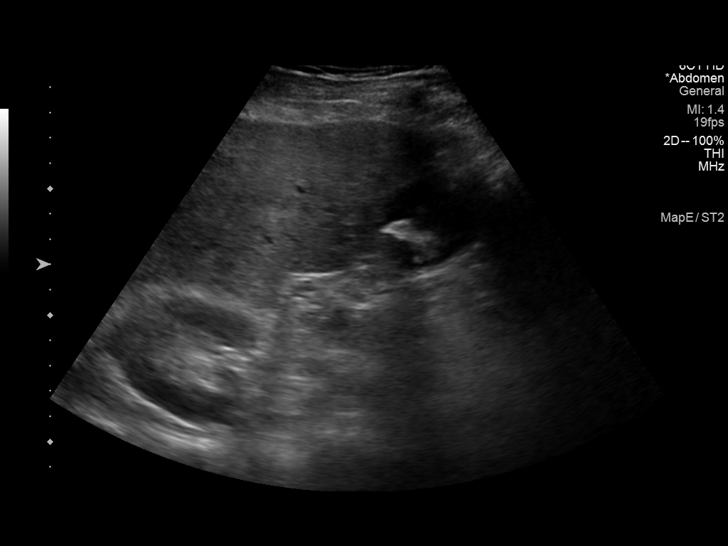
[im 15/90]
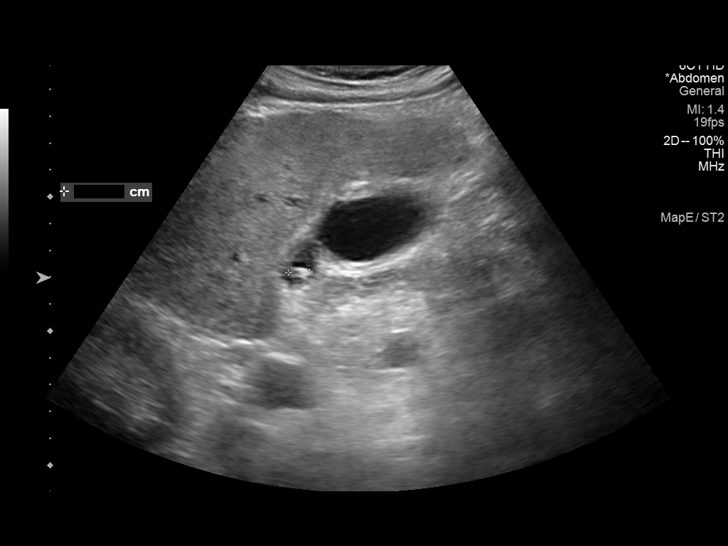
[im 23/90]
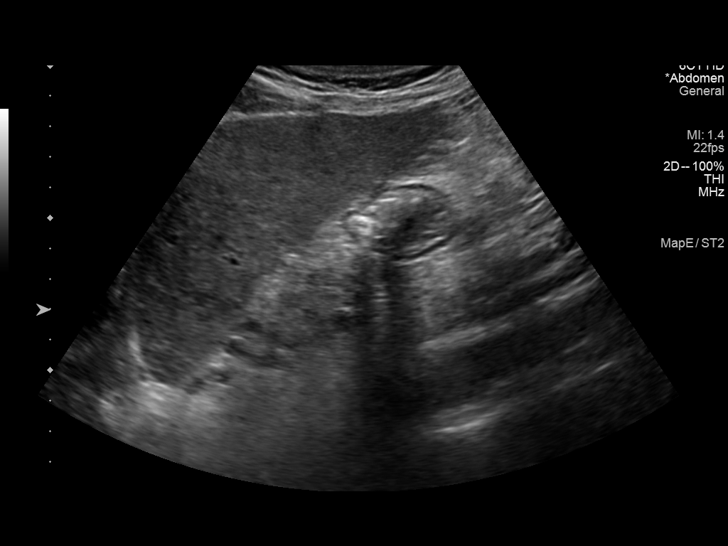
[im 30/90]
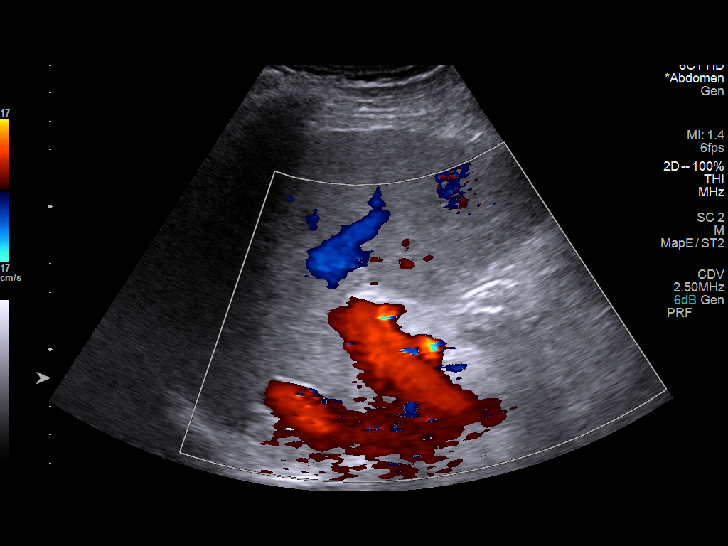
[im 38/90]
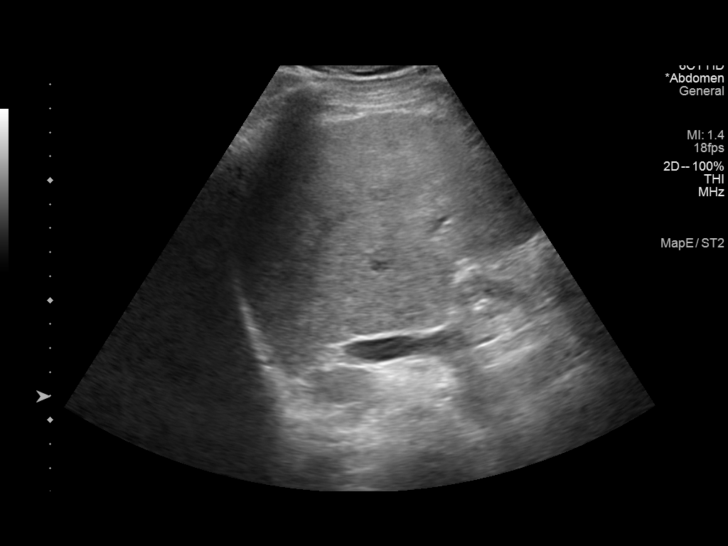
[im 45/90]
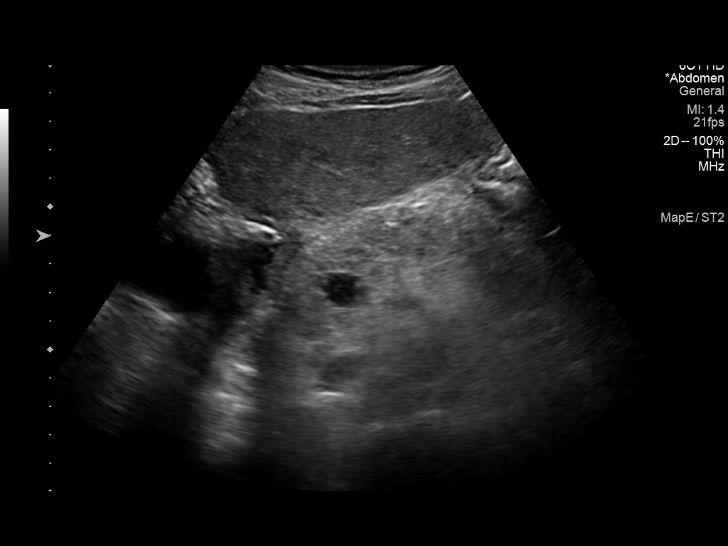
[im 52/90]
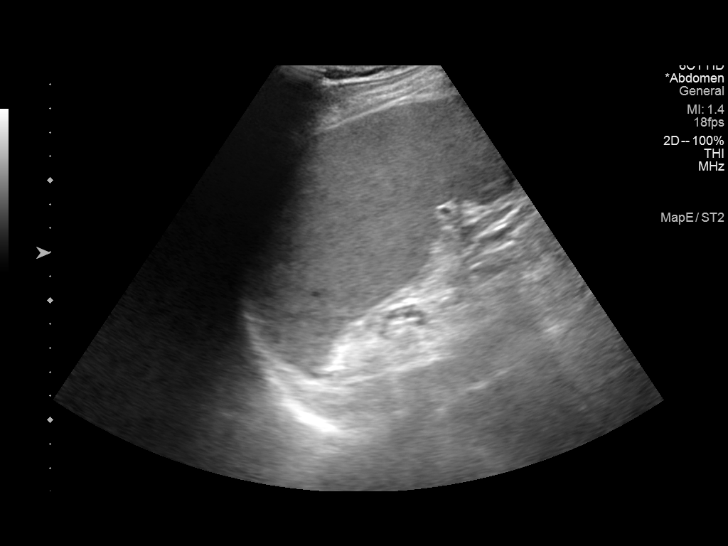
[im 60/90]
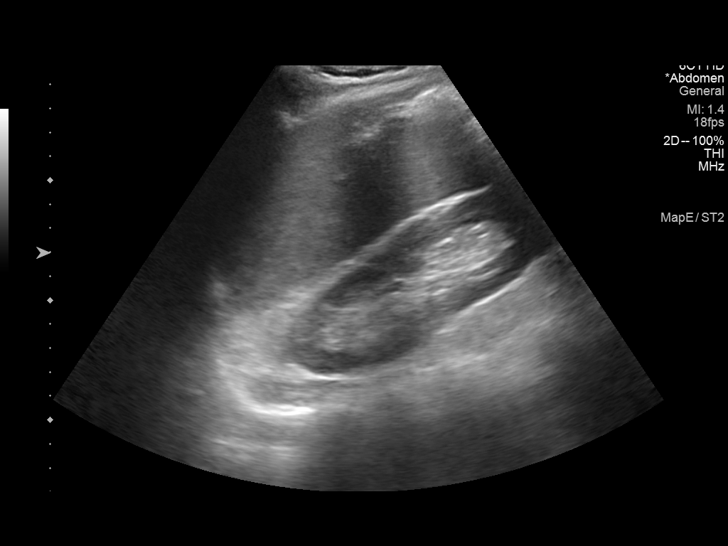
[im 67/90]
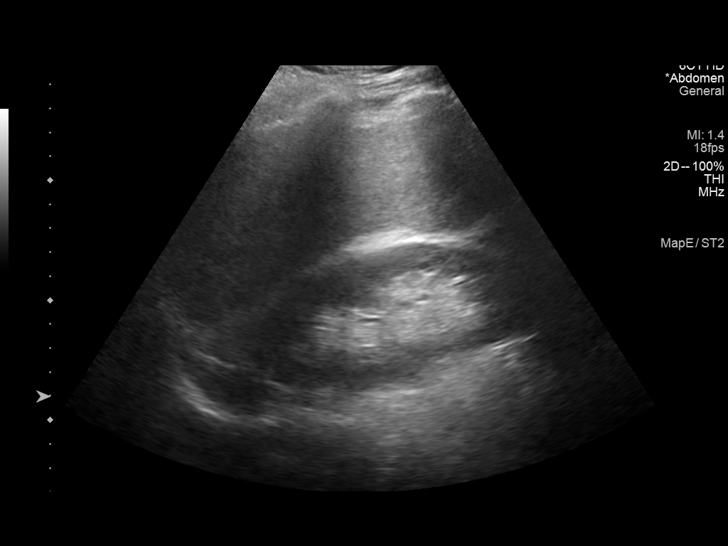
[im 75/90]
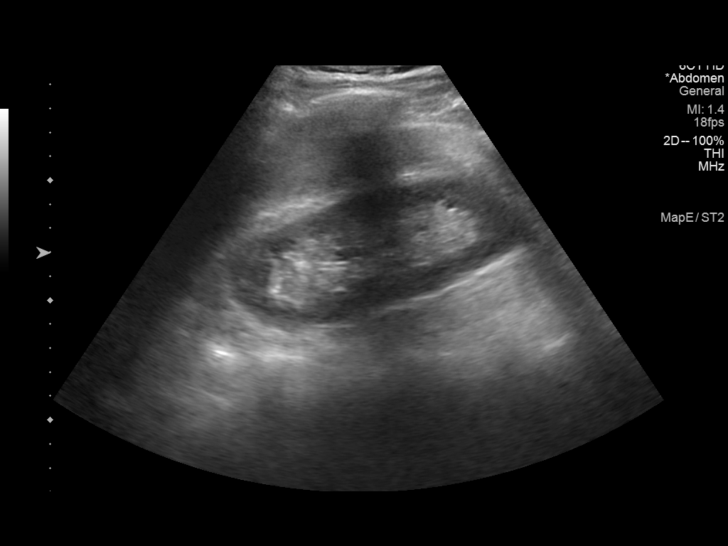
[im 82/90]
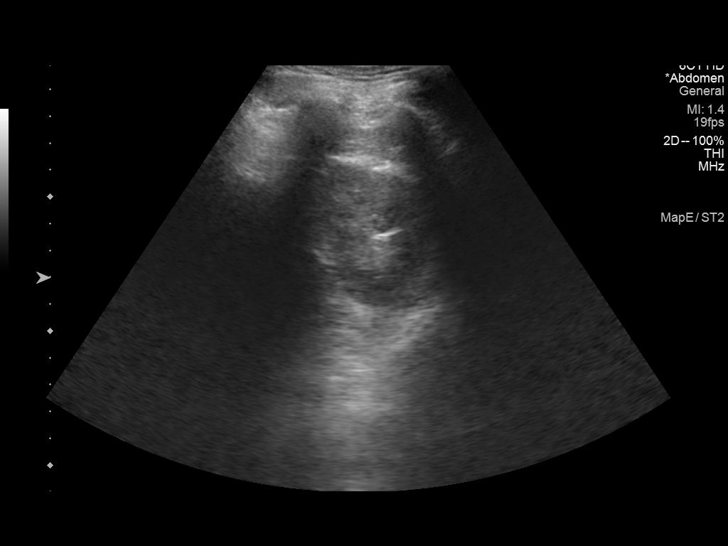
[im 90/90]
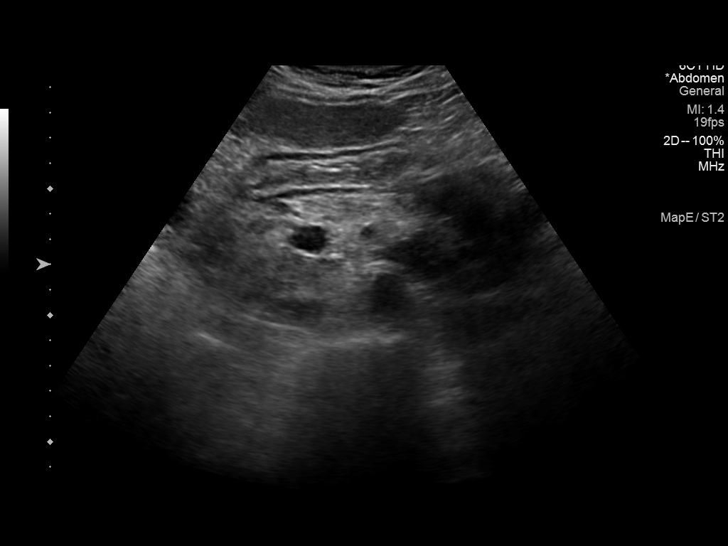

[13 of 25 positions shown; findings below may reference images not displayed]

FINDINGS: Gallbladder: There are several stones within the gallbladder with an
8 mm stone in the gallbladder neck. Mild edema and thickening of the
gallbladder measuring up to 6 mm in thickness. This may be related
to underlying liver disease. No pericholecystic fluid. Negative
sonographic Murphy's sign.

Common bile duct: Diameter: 3 mm

Liver: There is diffuse increased liver echogenicity most commonly
seen in the setting of fatty infiltration. Superimposed inflammation
or fibrosis is not excluded. Clinical correlation is recommended.
Portal vein is patent on color Doppler imaging with normal direction
of blood flow towards the liver.

IVC: No abnormality visualized.

Pancreas: Visualized portion unremarkable.

Spleen: Enlarged measuring up to 16 cm in length. The splenic volume
is 862 cc.

Right Kidney: Length: 12.3 cm. Echogenicity within normal limits. No
mass or hydronephrosis visualized.

Left Kidney: Length: 12.6 cm. Echogenicity within normal limits. No
mass or hydronephrosis visualized.

Abdominal aorta: No aneurysm visualized. There is atherosclerotic
calcification of the aorta.

Other findings: None.
IMPRESSION: 1. Splenomegaly.
2. Fatty liver with possible early changes of cirrhosis.
3. Patent main portal vein with hepatopetal flow.
4. No ascites.
5. Cholelithiasis without definite sonographic evidence of acute
cholecystitis. A hepatobiliary scintigraphy may provide better
evaluation of the gallbladder if there is a high clinical concern
for acute cholecystitis .

## 2021-06-10 ENCOUNTER — Other Ambulatory Visit: Payer: Self-pay | Admitting: Internal Medicine

## 2021-07-30 ENCOUNTER — Ambulatory Visit
Admission: EM | Admit: 2021-07-30 | Discharge: 2021-07-30 | Disposition: A | Discharge status: Home / Self Care | Payer: Self-pay | Attending: Physician Assistant | Admitting: Physician Assistant

## 2021-07-30 ENCOUNTER — Other Ambulatory Visit: Payer: Self-pay

## 2021-07-30 DIAGNOSIS — H6123 Impacted cerumen, bilateral: Secondary | ICD-10-CM

## 2021-07-30 NOTE — Discharge Instructions (Signed)
°  Please try over the counter wax softening drops over the next few days-- if no improvement please follow up for another attempt at irrigation.

## 2021-07-30 NOTE — ED Triage Notes (Signed)
Pt c/o ear fullness and decreased hearing onset this morning after using a qtip/ states tried hydrogen peroxide at home for about an 1 hour today without relief.

## 2021-07-30 NOTE — ED Provider Notes (Signed)
[name]    CSN: [id number] Arrival date & time: 07/30/21  0820      History   Chief Complaint Chief Complaint  Patient presents with   Ear Fullness         HPI YORK VALLIANT is a 61 y.o. male.   Patient here today for evaluation of ear fullness that started this morning after he took a shower and then used Qtips to clean out his ears. He reports that he has had decreased hearing in both ears since this time. He denies any other symptoms. He has not had fever.   The history is provided by the patient.   Past Medical History:  Diagnosis Date   CAD (coronary artery disease) 2011   DES to the proximal RCA   Dyslipidemia    GERD (gastroesophageal reflux disease)    Hypertension    NSTEMI (non-ST elevated myocardial infarction) (HCC) 2011   Tobacco dependence     Patient Active Problem List   Diagnosis Date Noted   Thrombocytopenia (HCC) 03/24/2020   Unstable angina (HCC) 08/30/2018   MI, acute, non ST segment elevation (HCC)    Hypertension    Dyslipidemia    Tobacco dependence    CAD (coronary artery disease)     Past Surgical History:  Procedure Laterality Date   CARDIAC CATHETERIZATION  01/28/2010   CORONARY STENT INTERVENTION N/A 08/30/2018   Procedure: CORONARY STENT INTERVENTION;  Surgeon: Swaziland, Peter M, MD;  Location: [facility] INVASIVE CV LAB;  Service: Cardiovascular;  Laterality: N/A;   HAND SURGERY     LEFT HAND   LEFT HEART CATH AND CORONARY ANGIOGRAPHY N/A 08/30/2018   Procedure: LEFT HEART CATH AND CORONARY ANGIOGRAPHY;  Surgeon: Swaziland, Peter M, MD;  Location: [facility] INVASIVE CV LAB;  Service: Cardiovascular;  Laterality: N/A;       Home Medications    Prior to Admission medications   Medication Sig Start Date End Date Taking? Authorizing Provider  acetaminophen (TYLENOL) 325 MG tablet Take 650 mg by mouth every 6 (six) hours as needed for moderate pain or headache.     [provider]  aspirin 81 MG tablet Take 81 mg by mouth  daily.      [provider]  atorvastatin (LIPITOR) 40 MG tablet Take 1 tablet (40 mg total) by mouth daily. 11/12/19   Hilty, Lisette Abu, MD  esomeprazole (NEXIUM) 20 MG capsule Take 20 mg by mouth every evening.    [provider]  metFORMIN (GLUCOPHAGE-XR) 500 MG 24 hr tablet Take 500 mg by mouth daily with breakfast. 1 Tablet Daily with Breakfast    [provider]  metoprolol succinate (TOPROL-XL) 25 MG 24 hr tablet TAKE 1 TABLET BY MOUTH ONCE DAILY; PLEASE SCHEDULE APPOINTMENT FOR FUTURE REFILLS 06/11/21   Hilty, Lisette Abu, MD  nitroGLYCERIN (NITROSTAT) 0.4 MG SL tablet Place 1 tablet (0.4 mg total) under the tongue every 5 (five) minutes as needed. 08/31/18   Swaziland, Peter M, MD  rosuvastatin (CRESTOR) 20 MG tablet Take 1 tablet (20 mg total) by mouth daily. 06/24/20 09/22/20  Hilty, Lisette Abu, MD    Family History Family History  Problem Relation Age of Onset   Hypertension Mother    Stroke Mother    Diabetes Mother    Lung cancer Mother    Emphysema Father     Social History Social History   Tobacco Use   Smoking status: Every Day    Packs/day: 1.00    Types: Cigarettes  Smokeless tobacco: Never  Substance Use Topics   Alcohol use: No   Drug use: No     Allergies   Patient has no known allergies.   Review of Systems Review of Systems  Constitutional:  Negative for chills and fever.  HENT:  Positive for hearing loss. Negative for congestion, ear pain and sore throat.   Eyes:  Negative for discharge and redness.  Respiratory:  Negative for cough and shortness of breath.   Neurological:  Negative for numbness.    Physical Exam Triage Vital Signs ED Triage Vitals  Enc Vitals Group     BP      Pulse      Resp      Temp      Temp src      SpO2      Weight      Height      Head Circumference      Peak Flow      Pain Score      Pain Loc      Pain Edu?      Excl. in GC?    No data found.  Updated Vital Signs BP (!) 163/70 (BP  Location: Left Arm)    Pulse (!) 58    Temp 97.8 F (36.6 C) (Oral)    Resp 18    SpO2 96%   Physical Exam Vitals and nursing note reviewed.  Constitutional:      General: He is not in acute distress.    Appearance: Normal appearance. He is not ill-appearing.  HENT:     Head: Normocephalic and atraumatic.     Right Ear: There is impacted cerumen.     Left Ear: There is impacted cerumen.     Ears:     Comments: After irrigation right TM WNL, left TM still not visualized due to cerumen in EAC despite irrigation attempts.  Eyes:     Conjunctiva/sclera: Conjunctivae normal.  Cardiovascular:     Rate and Rhythm: Normal rate.  Pulmonary:     Effort: Pulmonary effort is normal.  Neurological:     Mental Status: He is alert.  Psychiatric:        Mood and Affect: Mood normal.        Behavior: Behavior normal.        Thought Content: Thought content normal.     UC Treatments / Results  Labs (all labs ordered are listed, but only abnormal results are displayed) Labs Reviewed - No data to display  EKG   Radiology No results found.  Procedures Procedures (including critical care time)  Medications Ordered in UC Medications - No data to display  Initial Impression / Assessment and Plan / UC Course  I have reviewed the triage vital signs and the nursing notes.  Pertinent labs & imaging results that were available during my care of the patient were reviewed by me and considered in my medical decision making (see chart for details).    Irrigation successful for right cerumen impaction, unsuccessful with left despite multiple attempts and trial of cerumen softening with peroxide solution. Recommended OTC treatment and follow up if no resolution in 2-3 days.   Final Clinical Impressions(s) / UC Diagnoses   Final diagnoses:  Bilateral impacted cerumen     Discharge Instructions       Please try over the counter wax softening drops over the next few days-- if no  improvement please follow up for another attempt at irrigation.  ED Prescriptions   None    PDMP not reviewed this encounter.   Tomi BambergerMyers, Mahkai Fangman F, PA-C 07/30/21 [phone]

## 2021-08-01 ENCOUNTER — Other Ambulatory Visit: Payer: Self-pay

## 2021-08-01 ENCOUNTER — Ambulatory Visit
Admission: EM | Admit: 2021-08-01 | Discharge: 2021-08-01 | Disposition: A | Discharge status: Home / Self Care | Payer: Self-pay | Attending: Physician Assistant | Admitting: Physician Assistant

## 2021-08-01 ENCOUNTER — Encounter: Payer: Self-pay | Admitting: Emergency Medicine

## 2021-08-01 DIAGNOSIS — H6122 Impacted cerumen, left ear: Secondary | ICD-10-CM

## 2021-08-01 NOTE — ED Provider Notes (Signed)
[name]    CSN: [id number] Arrival date & time: 08/01/21  0845      History   Chief Complaint Chief Complaint  Patient presents with   Ear Fullness    HPI Philip Thomas is a 61 y.o. male.   Patient here today for evaluation of continued decreased hearing from his left ear. He reports he has had some improvement with treatment at home but returns in hope for full clearance of EAC. He continues to have no issue from his right ear since irrigation a few days prior.  The history is provided by the patient.  Ear Fullness Pertinent negatives include no shortness of breath.   Past Medical History:  Diagnosis Date   CAD (coronary artery disease) 2011   DES to the proximal RCA   Dyslipidemia    GERD (gastroesophageal reflux disease)    Hypertension    NSTEMI (non-ST elevated myocardial infarction) (HCC) 2011   Tobacco dependence     Patient Active Problem List   Diagnosis Date Noted   Thrombocytopenia (HCC) 03/24/2020   Unstable angina (HCC) 08/30/2018   MI, acute, non ST segment elevation (HCC)    Hypertension    Dyslipidemia    Tobacco dependence    CAD (coronary artery disease)     Past Surgical History:  Procedure Laterality Date   CARDIAC CATHETERIZATION  01/28/2010   CORONARY STENT INTERVENTION N/A 08/30/2018   Procedure: CORONARY STENT INTERVENTION;  Surgeon: Swaziland, Peter M, MD;  Location: [facility] INVASIVE CV LAB;  Service: Cardiovascular;  Laterality: N/A;   HAND SURGERY     LEFT HAND   LEFT HEART CATH AND CORONARY ANGIOGRAPHY N/A 08/30/2018   Procedure: LEFT HEART CATH AND CORONARY ANGIOGRAPHY;  Surgeon: Swaziland, Peter M, MD;  Location: [facility] INVASIVE CV LAB;  Service: Cardiovascular;  Laterality: N/A;       Home Medications    Prior to Admission medications   Medication Sig Start Date End Date Taking? Authorizing Provider  acetaminophen (TYLENOL) 325 MG tablet Take 650 mg by mouth every 6 (six) hours as needed for moderate pain or headache.      [provider]  aspirin 81 MG tablet Take 81 mg by mouth daily.      [provider]  atorvastatin (LIPITOR) 40 MG tablet Take 1 tablet (40 mg total) by mouth daily. 11/12/19   Hilty, Lisette Abu, MD  esomeprazole (NEXIUM) 20 MG capsule Take 20 mg by mouth every evening.    [provider]  metFORMIN (GLUCOPHAGE-XR) 500 MG 24 hr tablet Take 500 mg by mouth daily with breakfast. 1 Tablet Daily with Breakfast    [provider]  metoprolol succinate (TOPROL-XL) 25 MG 24 hr tablet TAKE 1 TABLET BY MOUTH ONCE DAILY; PLEASE SCHEDULE APPOINTMENT FOR FUTURE REFILLS 06/11/21   Hilty, Lisette Abu, MD  nitroGLYCERIN (NITROSTAT) 0.4 MG SL tablet Place 1 tablet (0.4 mg total) under the tongue every 5 (five) minutes as needed. 08/31/18   [place]il[place], Peter M, MD  rosuvastatin (CRESTOR) 20 MG tablet Take 1 tablet (20 mg total) by mouth daily. 06/24/20 09/22/20  Hilty, Lisette Abu, MD    Family History Family History  Problem Relation Age of Onset   Hypertension Mother    Stroke Mother    Diabetes Mother    Lung cancer Mother    Emphysema Father     Social History Social History   Tobacco Use   Smoking status: Every Day    Packs/day: 1.00  Types: Cigarettes   Smokeless tobacco: Never  Substance Use Topics   Alcohol use: No   Drug use: No     Allergies   Patient has no known allergies.   Review of Systems Review of Systems  Constitutional:  Negative for chills and fever.  HENT:  Positive for hearing loss. Negative for ear pain.   Eyes:  Negative for discharge and redness.  Respiratory:  Negative for shortness of breath.   Neurological:  Negative for numbness.    Physical Exam Triage Vital Signs ED Triage Vitals [08/01/21 0901]  Enc Vitals Group     BP (!) 157/91     Pulse Rate 61     Resp 18     Temp 98.2 F (36.8 C)     Temp Source Oral     SpO2 95 %     Weight      Height      Head Circumference      Peak Flow      Pain Score 0     Pain Loc       Pain Edu?      Excl. in GC?    No data found.  Updated Vital Signs BP (!) 157/91 (BP Location: Left Arm)    Pulse 61    Temp 98.2 F (36.8 C) (Oral)    Resp 18    SpO2 95%      Physical Exam Vitals and nursing note reviewed.  Constitutional:      General: He is not in acute distress.    Appearance: Normal appearance. He is not ill-appearing.  HENT:     Head: Normocephalic and atraumatic.     Ears:     Comments: Left TM initially not visualized due to cerumen in EAC-- there does appear to be less cerumen than at previous visit Eyes:     Conjunctiva/sclera: Conjunctivae normal.  Cardiovascular:     Rate and Rhythm: Normal rate.  Pulmonary:     Effort: Pulmonary effort is normal.  Neurological:     Mental Status: He is alert.  Psychiatric:        Mood and Affect: Mood normal.        Behavior: Behavior normal.        Thought Content: Thought content normal.     UC Treatments / Results  Labs (all labs ordered are listed, but only abnormal results are displayed) Labs Reviewed - No data to display  EKG   Radiology No results found.  Procedures Procedures (including critical care time)  Medications Ordered in UC Medications - No data to display  Initial Impression / Assessment and Plan / UC Course  I have reviewed the triage vital signs and the nursing notes.  Pertinent labs & imaging results that were available during my care of the patient were reviewed by me and considered in my medical decision making (see chart for details).    Left ear irrigated successfully in office- patient reports improvement of symptoms. Recommended follow up with any further concerns.   Final Clinical Impressions(s) / UC Diagnoses   Final diagnoses:  Impacted cerumen of left ear   Discharge Instructions   None    ED Prescriptions   None    PDMP not reviewed this encounter.   Tomi Bamberger, PA-C 08/01/21 [phone]

## 2021-08-01 NOTE — ED Triage Notes (Signed)
Pt here for ear fullness and hoping to get the wax out of his left ear

## 2021-08-27 ENCOUNTER — Other Ambulatory Visit: Payer: Self-pay

## 2021-08-27 ENCOUNTER — Ambulatory Visit
Admission: EM | Admit: 2021-08-27 | Discharge: 2021-08-27 | Disposition: A | Discharge status: Home / Self Care | Payer: Self-pay

## 2021-08-27 DIAGNOSIS — L723 Sebaceous cyst: Secondary | ICD-10-CM

## 2021-08-27 NOTE — ED Triage Notes (Signed)
Pt c/o cyst to back states it has been there for years and his wife asked him to come see if it can be relieved.

## 2021-08-27 NOTE — ED Provider Notes (Signed)
[name]    CSN: [id number] Arrival date & time: 08/27/21  1707      History   Chief Complaint Chief Complaint  Patient presents with   Abscess    HPI Philip Thomas is a 61 y.o. male.   Patient here today for evaluation of cyst lesion to his upper back that has been present for several years.  He reports that area has not painful or bothersome.  He denies any concerns.  The history is provided by the patient.   Past Medical History:  Diagnosis Date   CAD (coronary artery disease) 2011   DES to the proximal RCA   Dyslipidemia    GERD (gastroesophageal reflux disease)    Hypertension    NSTEMI (non-ST elevated myocardial infarction) (HCC) 2011   Tobacco dependence     Patient Active Problem List   Diagnosis Date Noted   Thrombocytopenia (HCC) 03/24/2020   Unstable angina (HCC) 08/30/2018   MI, acute, non ST segment elevation (HCC)    Hypertension    Dyslipidemia    Tobacco dependence    CAD (coronary artery disease)     Past Surgical History:  Procedure Laterality Date   CARDIAC CATHETERIZATION  01/28/2010   CORONARY STENT INTERVENTION N/A 08/30/2018   Procedure: CORONARY STENT INTERVENTION;  Surgeon: Philip Thomas;  Location: [facility] INVASIVE CV LAB;  Service: Cardiovascular;  Laterality: N/A;   HAND SURGERY     LEFT HAND   LEFT HEART CATH AND CORONARY ANGIOGRAPHY N/A 08/30/2018   Procedure: LEFT HEART CATH AND CORONARY ANGIOGRAPHY;  Surgeon: Philip Thomas;  Location: [facility] INVASIVE CV LAB;  Service: Cardiovascular;  Laterality: N/A;       Home Medications    Prior to Admission medications   Medication Sig Start Date End Date Taking? Authorizing Provider  acetaminophen (TYLENOL) 325 MG tablet Take 650 mg by mouth every 6 (six) hours as needed for moderate pain or headache.     Provider, Historical, Thomas  aspirin 81 MG tablet Take 81 mg by mouth daily.      Provider, Historical, Thomas  atorvastatin (LIPITOR) 40 MG tablet Take 1 tablet (40 mg  total) by mouth daily. 11/12/19   Philip Thomas  esomeprazole (NEXIUM) 20 MG capsule Take 20 mg by mouth every evening.    Provider, Historical, Thomas  metFORMIN (GLUCOPHAGE-XR) 500 MG 24 hr tablet Take 500 mg by mouth daily with breakfast. 1 Tablet Daily with Breakfast    Provider, Historical, Thomas  metoprolol succinate (TOPROL-XL) 25 MG 24 hr tablet TAKE 1 TABLET BY MOUTH ONCE DAILY; PLEASE SCHEDULE APPOINTMENT FOR FUTURE REFILLS 06/11/21   Philip Thomas  nitroGLYCERIN (NITROSTAT) 0.4 MG SL tablet Place 1 tablet (0.4 mg total) under the tongue every 5 (five) minutes as needed. 08/31/18   Philip Thomas  rosuvastatin (CRESTOR) 20 MG tablet Take 1 tablet (20 mg total) by mouth daily. 06/24/20 09/22/20  Philip Thomas    Family History Family History  Problem Relation Age of Onset   Hypertension Mother    Stroke Mother    Diabetes Mother    Lung cancer Mother    Emphysema Father     Social History Social History   Tobacco Use   Smoking status: Every Day    Packs/day: 1.00    Types: Cigarettes   Smokeless tobacco: Never  Substance Use Topics   Alcohol use: No   Drug use: No  Allergies   Patient has no known allergies.   Review of Systems Review of Systems  Constitutional:  Negative for chills and fever.  Eyes:  Negative for discharge and redness.  Skin:  Negative for color change and wound.    Physical Exam Triage Vital Signs ED Triage Vitals  Enc Vitals Group     BP      Pulse      Resp      Temp      Temp src      SpO2      Weight      Height      Head Circumference      Peak Flow      Pain Score      Pain Loc      Pain Edu?      Excl. in GC?    No data found.  Updated Vital Signs BP (!) 156/83 (BP Location: Left Arm)    Pulse (!) 59    Temp 98.1 F (36.7 C) (Oral)    Resp 18    SpO2 95%      Physical Exam Vitals and nursing note reviewed.  Constitutional:      General: He is not in acute distress.    Appearance: Normal  appearance. He is not ill-appearing.  HENT:     Head: Normocephalic and atraumatic.  Eyes:     Conjunctiva/sclera: Conjunctivae normal.  Cardiovascular:     Rate and Rhythm: Normal rate.  Pulmonary:     Effort: Pulmonary effort is normal.  Skin:    Comments: Approx 4 cm area of flesh-toned induration to midline upper back without erythema, drainage, wound, TTP  Neurological:     Mental Status: He is alert.  Psychiatric:        Mood and Affect: Mood normal.        Behavior: Behavior normal.        Thought Content: Thought content normal.     UC Treatments / Results  Labs (all labs ordered are listed, but only abnormal results are displayed) Labs Reviewed - No data to display  EKG   Radiology No results found.  Procedures Procedures (including critical care time)  Medications Ordered in UC Medications - No data to display  Initial Impression / Assessment and Plan / UC Course  I have reviewed the triage vital signs and the nursing notes.  Pertinent labs & imaging results that were available during my care of the patient were reviewed by me and considered in my medical decision making (see chart for details).    Suspect likely sebaceous cyst but given location recommend further evaluation by general surgery for removal.  Discussed benign nature of cyst.  Encouraged follow-up with any further concerns.  Final Clinical Impressions(s) / UC Diagnoses   Final diagnoses:  Sebaceous cyst   Discharge Instructions   None    ED Prescriptions   None    PDMP not reviewed this encounter.   Philip Bamberger, PA-C 08/27/21 1808

## 2021-09-07 ENCOUNTER — Other Ambulatory Visit: Payer: Self-pay | Admitting: Internal Medicine

## 2022-01-03 ENCOUNTER — Encounter: Payer: Self-pay | Admitting: Internal Medicine

## 2022-01-03 ENCOUNTER — Ambulatory Visit (INDEPENDENT_AMBULATORY_CARE_PROVIDER_SITE_OTHER): Payer: Self-pay | Admitting: Internal Medicine

## 2022-01-03 VITALS — BP 136/74 | HR 49 | Ht 64.0 in | Wt 193.8 lb

## 2022-01-03 DIAGNOSIS — R079 Chest pain, unspecified: Secondary | ICD-10-CM

## 2022-01-03 DIAGNOSIS — I2510 Atherosclerotic heart disease of native coronary artery without angina pectoris: Secondary | ICD-10-CM

## 2022-01-03 DIAGNOSIS — E782 Mixed hyperlipidemia: Secondary | ICD-10-CM

## 2022-01-03 DIAGNOSIS — R6884 Jaw pain: Secondary | ICD-10-CM

## 2022-01-03 MED ORDER — ROSUVASTATIN CALCIUM 5 MG PO TABS: 5.0000 mg | ORAL_TABLET | ORAL | 3 refills | Status: DC

## 2022-01-04 ENCOUNTER — Telehealth: Payer: Self-pay | Admitting: Licensed Clinical Social Worker

## 2022-01-06 ENCOUNTER — Telehealth: Payer: Self-pay | Admitting: Licensed Clinical Social Worker

## 2022-01-06 NOTE — Telephone Encounter (Signed)
LCSW attempted to reach pt this morning at [phone]. Pt without insurance, LCSW left 2nd message for return call to assess for SDOH needs/assistance application eligibility. Will re-attempt as able.    Octavio Graves, MSW, LCSW Clinical Social Worker II [facility] Navigation  [phone]- work cell phone (preferred) [phone]- desk phone

## 2022-01-07 ENCOUNTER — Telehealth: Payer: Self-pay | Admitting: Licensed Clinical Social Worker

## 2022-01-07 NOTE — Telephone Encounter (Addendum)
LCSW attempted to reach pt this morning at [phone]. Pt without insurance, LCSW left 3nd message for return call to assess for SDOH needs/assistance application eligibility. Will no actively follow at this time- will mail pt assistance application (CAFA) and my card. Remain available should pt wish to engage with assistance.    Philip Thomas, MSW, LCSW Clinical Social Worker II [facility] Navigation  [phone]- work cell phone (preferred) [phone]- desk phone

## 2022-01-21 ENCOUNTER — Telehealth (HOSPITAL_COMMUNITY): Payer: Self-pay | Admitting: *Deleted

## 2022-01-21 NOTE — Telephone Encounter (Signed)
Close encounter 

## 2022-01-24 ENCOUNTER — Telehealth: Payer: Self-pay | Admitting: *Deleted

## 2022-01-24 NOTE — Telephone Encounter (Signed)
Follow Up:     Patient's wife is returning call from Friday.

## 2022-01-25 NOTE — Telephone Encounter (Signed)
Left message for pt to call.

## 2022-01-25 NOTE — Telephone Encounter (Signed)
Wife is calling back because she said no one return her call on yesterday. Patient procedure is on tomorrow. She has questions/concerns. Please advise

## 2022-01-25 NOTE — Telephone Encounter (Signed)
Spoke with pt wife, aware he can only have liquids 4 hours prior to the test and no caffeine 12 hours prior. No smoking 4 hours prior. He was instructed to not take his metoprolol the morning of the test. He was also instructed to not take metformin. All questions answered. Aware will forward to the nuclear tech and they may call if more information is needed.

## 2022-01-26 ENCOUNTER — Ambulatory Visit (HOSPITAL_COMMUNITY)
Admission: RE | Admit: 2022-01-26 | Discharge: 2022-01-26 | Disposition: A | Discharge status: Home / Self Care | Payer: Self-pay | Source: Ambulatory Visit | Attending: Cardiovascular Disease | Admitting: Cardiovascular Disease

## 2022-01-26 DIAGNOSIS — I2510 Atherosclerotic heart disease of native coronary artery without angina pectoris: Secondary | ICD-10-CM | POA: Insufficient documentation

## 2022-01-26 DIAGNOSIS — E782 Mixed hyperlipidemia: Secondary | ICD-10-CM | POA: Insufficient documentation

## 2022-01-26 DIAGNOSIS — R079 Chest pain, unspecified: Secondary | ICD-10-CM | POA: Insufficient documentation

## 2022-01-26 DIAGNOSIS — R6884 Jaw pain: Secondary | ICD-10-CM | POA: Insufficient documentation

## 2022-01-26 LAB — MYOCARDIAL PERFUSION IMAGING
LV dias vol: 93 mL (ref 62–150)
LV sys vol: 48 mL
Nuc Stress EF: 48 %
Peak HR: 62 {beats}/min
Rest HR: 53 {beats}/min
Rest Nuclear Isotope Dose: 11 mCi
SDS: 1
SRS: 0
SSS: 1
ST Depression (mm): 0 mm
Stress Nuclear Isotope Dose: 32.6 mCi
TID: 1.1

## 2022-01-26 MED ORDER — TECHNETIUM TC 99M TETROFOSMIN IV KIT
11.0000 | PACK | Freq: Once | INTRAVENOUS | Status: AC | PRN
  Administered 2022-01-26: 11 via INTRAVENOUS

## 2022-01-26 MED ORDER — REGADENOSON 0.4 MG/5ML IV SOLN
0.4000 mg | Freq: Once | INTRAVENOUS | Status: AC
  Administered 2022-01-26: 0.4 mg via INTRAVENOUS

## 2022-01-26 MED ORDER — TECHNETIUM TC 99M TETROFOSMIN IV KIT
32.6000 | PACK | Freq: Once | INTRAVENOUS | Status: AC | PRN
  Administered 2022-01-26: 32.6 via INTRAVENOUS

## 2022-02-04 ENCOUNTER — Encounter: Payer: Self-pay | Admitting: Internal Medicine

## 2022-04-04 ENCOUNTER — Ambulatory Visit: Payer: Self-pay | Attending: Physician Assistant | Admitting: Physician Assistant

## 2022-04-04 ENCOUNTER — Telehealth: Payer: Self-pay | Admitting: Licensed Clinical Social Worker

## 2022-04-04 ENCOUNTER — Encounter: Payer: Self-pay | Admitting: Physician Assistant

## 2022-04-04 VITALS — BP 138/72 | HR 58 | Ht 64.0 in | Wt 198.0 lb

## 2022-04-04 DIAGNOSIS — R0609 Other forms of dyspnea: Secondary | ICD-10-CM

## 2022-04-04 DIAGNOSIS — E785 Hyperlipidemia, unspecified: Secondary | ICD-10-CM

## 2022-04-04 DIAGNOSIS — I10 Essential (primary) hypertension: Secondary | ICD-10-CM

## 2022-04-04 DIAGNOSIS — I2510 Atherosclerotic heart disease of native coronary artery without angina pectoris: Secondary | ICD-10-CM

## 2022-04-04 MED ORDER — ISOSORBIDE MONONITRATE ER 30 MG PO TB24: 15.0000 mg | ORAL_TABLET | Freq: Every day | ORAL | 3 refills | Status: DC

## 2022-04-04 NOTE — Patient Instructions (Signed)
Medication Instructions:  Your physician has recommended you make the following change in your medication:  START: Imdur 15mg  daily.  *If you need a refill on your cardiac medications before your next appointment, please call your pharmacy*   Lab Work: NONE If you have labs (blood work) drawn today and your tests are completely normal, you will receive your results only by: [place] (if you have MyChart) OR A paper copy in the mail If you have any lab test that is abnormal or we need to change your treatment, we will call you to review the results.   Testing/Procedures: NONE    Follow-Up: At [facility], you and your health needs are our priority.  As part of our continuing mission to provide you with exceptional heart care, we have created designated Provider Care Teams.  These Care Teams include your primary Cardiologist (physician) and Advanced Practice Providers (APPs -  Physician Assistants and Nurse Practitioners) who all work together to provide you with the care you need, when you need it.  We recommend signing up for the patient portal called "MyChart".  Sign up information is provided on this After Visit Summary.  MyChart is used to connect with patients for Virtual Visits (Telemedicine).  Patients are able to view lab/test results, encounter notes, upcoming appointments, etc.  Non-urgent messages can be sent to your provider as well.   To learn more about what you can do with MyChart, go to [email or web address].    Your next appointment:   3-4 month(s)  The format for your next appointment:   In Person  Provider:   Pixie Casino, MD

## 2022-04-04 NOTE — Progress Notes (Unsigned)
Cardiology Office Note:    Date:  04/06/2022   ID:  Philip Thomas, Philip Thomas August 31, 1960, MRN [id number]  PCP:  Lance Bosch, NP   [facility] Providers Cardiologist:  Chrystie Nose, MD     Referring MD: Lance Bosch, NP   Chief Complaint  Patient presents with   Follow-up    Seen for Dr. Rennis Golden    History of Present Illness:    Philip Thomas is a 61 y.o. male with a hx of CAD, hyperlipidemia, hypertension and tobacco dependence.  Patient had a STEMI in 2011 and underwent DES to proximal RCA.  He does not take aspirin on a regular basis.  He underwent left heart cath again in 2020, this revealed a 95% stenosis to the mid RCA treated with another DES.,  There was some residual disease in LAD and left circumflex artery.  EF was normal.  Myoview in 2021 demonstrated EF 54%, small area of inferior infarct, no reversible ischemia.  Most recently, patient was seen by Dr. Rennis Golden on 01/03/2022 at which time he continued to have intermittent chest pain and was still smoking.  EKG showed a heart rate was in the 40s.  LDL 91.  He could not tolerate the Lipitor 40 mg daily or Crestor 20 mg daily.  He underwent a repeat nuclear stress test on 01/26/2022 which came back low risk, no significant ischemia.  Patient presents today accompanied by his wife.  He continues to have shortness of breath with heavy exertion however no chest pain or jaw pain like what he experienced back in 2020.  Recent Myoview was reassuring.  He had blood work this morning that was ordered by Dr. Rennis Golden for his cholesterol.  He has not seen his PCP yet to discussed shortness of breath evaluation, I urged him to do so.  He may need a pulmonary function test first and if abnormal need a referral to pulmonology service.  I recommend a 3 to 59-month follow-up.  I did start him on low-dose Imdur 15 mg daily to improve the coronary blood flow to see if that may help with his symptoms.  He is aware to contact cardiology service if  he started having chest pain and jaw pain that is reminiscent of previous angina.   Past Medical History:  Diagnosis Date   CAD (coronary artery disease) 2011   DES to the proximal RCA   Dyslipidemia    GERD (gastroesophageal reflux disease)    Hypertension    NSTEMI (non-ST elevated myocardial infarction) (HCC) 2011   Tobacco dependence     Past Surgical History:  Procedure Laterality Date   CARDIAC CATHETERIZATION  01/28/2010   CORONARY STENT INTERVENTION N/A 08/30/2018   Procedure: CORONARY STENT INTERVENTION;  Surgeon: Swaziland, Peter M, MD;  Location: [facility] INVASIVE CV LAB;  Service: Cardiovascular;  Laterality: N/A;   HAND SURGERY     LEFT HAND   LEFT HEART CATH AND CORONARY ANGIOGRAPHY N/A 08/30/2018   Procedure: LEFT HEART CATH AND CORONARY ANGIOGRAPHY;  Surgeon: Swaziland, Peter M, MD;  Location: [facility] INVASIVE CV LAB;  Service: Cardiovascular;  Laterality: N/A;    Current Medications: Current Meds  Medication Sig   acetaminophen (TYLENOL) 325 MG tablet Take 650 mg by mouth every 6 (six) hours as needed for moderate pain or headache.    aspirin 81 MG tablet Take 81 mg by mouth daily.     esomeprazole (NEXIUM) 20 MG capsule Take 20 mg by mouth every evening.  isosorbide mononitrate (IMDUR) 30 MG 24 hr tablet Take 0.5 tablets (15 mg total) by mouth daily.   metFORMIN (GLUCOPHAGE-XR) 500 MG 24 hr tablet Take 500 mg by mouth daily with breakfast. 1 Tablet Daily with Breakfast   metoprolol succinate (TOPROL-XL) 50 MG 24 hr tablet Take 50 mg by mouth daily.   nitroGLYCERIN (NITROSTAT) 0.4 MG SL tablet Place 1 tablet (0.4 mg total) under the tongue every 5 (five) minutes as needed.   [DISCONTINUED] rosuvastatin (CRESTOR) 5 MG tablet Take 1 tablet (5 mg total) by mouth 3 (three) times a week.     Allergies:   Statins   Social History   Socioeconomic History   Marital status: Married    Spouse name: Not on file   Number of children: 3   Years of education: Not on file   Highest  education level: Not on file  Occupational History   Occupation: roofer  Tobacco Use   Smoking status: Every Day    Packs/day: 1.00    Types: Cigarettes   Smokeless tobacco: Never  Substance and Sexual Activity   Alcohol use: No   Drug use: No   Sexual activity: Yes  Other Topics Concern   Not on file  Social History Narrative   Not on file   Social Determinants of Health   Financial Resource Strain: Not on file  Food Insecurity: Not on file  Transportation Needs: Not on file  Physical Activity: Not on file  Stress: Not on file  Social Connections: Not on file     Family History: The patient's family history includes Diabetes in his mother; Emphysema in his father; Hypertension in his mother; Lung cancer in his mother; Stroke in his mother.  ROS:   Please see the history of present illness.     All other systems reviewed and are negative.  EKGs/Labs/Other Studies Reviewed:    The following studies were reviewed today:  Myoview 01/26/2022   The study is normal. The study is low risk.   No ST deviation was noted.   Left ventricular function is normal. End diastolic cavity size is normal.   Prior study available for comparison from 11/01/2019.   EKG:  EKG is not ordered today.    Recent Labs: No results found for requested labs within last 365 days.  Recent Lipid Panel    Component Value Date/Time   CHOL 97 (L) 11/21/2018 0816   TRIG 90 11/21/2018 0816   HDL 37 (L) 11/21/2018 0816   CHOLHDL 2.6 11/21/2018 0816   CHOLHDL 5 12/30/2013 1004   VLDL 45.4 (H) 12/30/2013 1004   LDLCALC 42 11/21/2018 0816   LDLDIRECT 69.2 09/17/2012 0826     Risk Assessment/Calculations:           Physical Exam:    VS:  BP 138/72   Pulse (!) 58   Ht 5\' 4"  (1.626 m)   Wt 198 lb (89.8 kg)   SpO2 96%   BMI 33.99 kg/m        Wt Readings from Last 3 Encounters:  04/04/22 198 lb (89.8 kg)  01/26/22 193 lb (87.5 kg)  01/03/22 193 lb 12.8 oz (87.9 kg)     GEN:  Well  nourished, well developed in no acute distress HEENT: Normal NECK: No JVD; No carotid bruits LYMPHATICS: No lymphadenopathy CARDIAC: RRR, no murmurs, rubs, gallops RESPIRATORY:  Clear to auscultation without rales, wheezing or rhonchi  ABDOMEN: Soft, non-tender, non-distended MUSCULOSKELETAL:  No edema; No deformity  SKIN: Warm  and dry NEUROLOGIC:  Alert and oriented x 3 PSYCHIATRIC:  Normal affect   ASSESSMENT:    1. Coronary artery disease involving native coronary artery of native heart without angina pectoris   2. Essential hypertension   3. Hyperlipidemia LDL goal <70   4. DOE (dyspnea on exertion)    PLAN:    In order of problems listed above:  CAD: Patient denies any chest pain or jaw pain that is reminiscent of the previous angina.  Recent Myoview was reassuring.  At this time I do not recommend any further work-up.  I recommended low-dose Imdur for antianginal purposes  Hypertension: Blood pressure stable  Hyperlipidemia: On Crestor  Dyspnea on exertion: Dr. Debara Pickett recommended the patient to see PCP and potentially consider pulmonary evaluation given reassuring Myoview.           Medication Adjustments/Labs and Tests Ordered: Current medicines are reviewed at length with the patient today.  Concerns regarding medicines are outlined above.  No orders of the defined types were placed in this encounter.  Meds ordered this encounter  Medications   isosorbide mononitrate (IMDUR) 30 MG 24 hr tablet    Sig: Take 0.5 tablets (15 mg total) by mouth daily.    Dispense:  45 tablet    Refill:  3    Patient Instructions  Medication Instructions:  Your physician has recommended you make the following change in your medication:  START: Imdur 15mg  daily.  *If you need a refill on your cardiac medications before your next appointment, please call your pharmacy*   Lab Work: NONE If you have labs (blood work) drawn today and your tests are completely normal, you will  receive your results only by: [place] (if you have MyChart) OR A paper copy in the mail If you have any lab test that is abnormal or we need to change your treatment, we will call you to review the results.   Testing/Procedures: NONE    Follow-Up: At [facility], you and your health needs are our priority.  As part of our continuing mission to provide you with exceptional heart care, we have created designated Provider Care Teams.  These Care Teams include your primary Cardiologist (physician) and Advanced Practice Providers (APPs -  Physician Assistants and Nurse Practitioners) who all work together to provide you with the care you need, when you need it.  We recommend signing up for the patient portal called "MyChart".  Sign up information is provided on this After Visit Summary.  MyChart is used to connect with patients for Virtual Visits (Telemedicine).  Patients are able to view lab/test results, encounter notes, upcoming appointments, etc.  Non-urgent messages can be sent to your provider as well.   To learn more about what you can do with MyChart, go to [email or web address].    Your next appointment:   3-4 month(s)  The format for your next appointment:   In Person  Provider:   Pixie Casino, MD     Signed, Almyra Deforest, [place]  04/06/2022 11:16 PM    [place]

## 2022-04-04 NOTE — Telephone Encounter (Signed)
H&V Care Navigation CSW Progress Note  Clinical Social Worker mailed pt a new copy of [facility] Clinical biochemist) and my card  to try and connect again as pt remains uninsured noted today after appt complete. Previously was unable to make contact with the pt on three different occasions. I remain available.   Patient is participating in a Managed Medicaid Plan:  No, self pay only.   SDOH Screenings   Tobacco Use: High Risk (04/04/2022)   Westley Hummer, MSW, [place]  [phone]- work cell phone (preferred) [phone]- desk phone

## 2022-04-05 ENCOUNTER — Other Ambulatory Visit: Payer: Self-pay | Admitting: *Deleted

## 2022-04-05 DIAGNOSIS — E782 Mixed hyperlipidemia: Secondary | ICD-10-CM

## 2022-04-05 LAB — NMR, LIPOPROFILE
Cholesterol, Total: 158 mg/dL (ref 100–199)
HDL Particle Number: 21 umol/L — ABNORMAL LOW (ref >=30.5)
HDL-C: 22 mg/dL — ABNORMAL LOW (ref >39)
LDL Particle Number: 1517 nmol/L — ABNORMAL HIGH (ref <1000)
LDL Size: 19.7 nm — ABNORMAL LOW (ref >20.5)
LDL-C (NIH Calc): 90 mg/dL (ref 0–99)
LP-IR Score: 75 — ABNORMAL HIGH (ref <=45)
Small LDL Particle Number: 1303 nmol/L — ABNORMAL HIGH (ref <=527)
Triglycerides: 275 mg/dL — ABNORMAL HIGH (ref 0–149)

## 2022-04-05 LAB — LIPOPROTEIN A (LPA): Lipoprotein (a): <8.4 nmol/L (ref <75.0)

## 2022-04-05 MED ORDER — ROSUVASTATIN CALCIUM 5 MG PO TABS: 5.0000 mg | ORAL_TABLET | Freq: Every day | ORAL | 3 refills | Status: DC

## 2022-04-06 ENCOUNTER — Encounter: Payer: Self-pay | Admitting: Physician Assistant

## 2022-04-06 ENCOUNTER — Encounter: Payer: Self-pay | Admitting: Internal Medicine

## 2022-04-28 ENCOUNTER — Other Ambulatory Visit: Payer: Self-pay | Admitting: *Deleted

## 2022-04-28 DIAGNOSIS — E782 Mixed hyperlipidemia: Secondary | ICD-10-CM

## 2022-05-25 ENCOUNTER — Encounter: Payer: Self-pay | Admitting: Pulmonary Disease

## 2022-05-25 ENCOUNTER — Ambulatory Visit (INDEPENDENT_AMBULATORY_CARE_PROVIDER_SITE_OTHER): Payer: Self-pay | Admitting: Pulmonary Disease

## 2022-05-25 ENCOUNTER — Ambulatory Visit (INDEPENDENT_AMBULATORY_CARE_PROVIDER_SITE_OTHER): Payer: Self-pay

## 2022-05-25 VITALS — BP 136/70 | HR 53 | Temp 97.5°F | Ht 64.0 in | Wt 196.8 lb

## 2022-05-25 DIAGNOSIS — I200 Unstable angina: Secondary | ICD-10-CM

## 2022-05-25 DIAGNOSIS — R059 Cough, unspecified: Secondary | ICD-10-CM

## 2022-05-25 MED ORDER — TRELEGY ELLIPTA 100-62.5-25 MCG/ACT IN AEPB: 1.0000 | INHALATION_SPRAY | Freq: Every day | RESPIRATORY_TRACT | 0 refills | Status: DC

## 2022-05-25 MED ORDER — TRELEGY ELLIPTA 100-62.5-25 MCG/ACT IN AEPB: 1.0000 | INHALATION_SPRAY | Freq: Every day | RESPIRATORY_TRACT | 5 refills | Status: DC

## 2022-05-25 NOTE — Progress Notes (Signed)
Philip Thomas    [id number]    [birth year]  Primary Care Physician:Blevins, Sue Lush, NP  Referring Physician: Lance Bosch, NP [address] [place],  [place] [address]  Chief complaint:   Patient being seen for shortness of breath on exertion  HPI:  Shortness of breath on exertion for multiple months Would usually feel more short of breath whenever he gets in a hurry and doing anything  He is an active smoker, smokes about 5 cigarettes a day now Was smoking up to 2 packs in the past  Does have a history of coronary artery disease History of diabetes, hypercholesterolemia S/p PCI  Spouse accompanied him for the visit today  Has not had PFTs in the past  Started smoking at age 70, at the heaviest was smoking about 2 packs a day  Outpatient Encounter Medications as of 05/25/2022  Medication Sig   acetaminophen (TYLENOL) 325 MG tablet Take 650 mg by mouth every 6 (six) hours as needed for moderate pain or headache.    aspirin 81 MG tablet Take 81 mg by mouth daily.     esomeprazole (NEXIUM) 20 MG capsule Take 20 mg by mouth every evening.   isosorbide mononitrate (IMDUR) 30 MG 24 hr tablet Take 0.5 tablets (15 mg total) by mouth daily.   metFORMIN (GLUCOPHAGE-XR) 500 MG 24 hr tablet Take 500 mg by mouth daily with breakfast. 1 Tablet Daily with Breakfast   metoprolol succinate (TOPROL-XL) 50 MG 24 hr tablet Take 50 mg by mouth daily.   nitroGLYCERIN (NITROSTAT) 0.4 MG SL tablet Place 1 tablet (0.4 mg total) under the tongue every 5 (five) minutes as needed.   rosuvastatin (CRESTOR) 5 MG tablet Take 1 tablet (5 mg total) by mouth daily.   No facility-administered encounter medications on file as of 05/25/2022.    Allergies as of 05/25/2022 - Review Complete 05/25/2022  Allergen Reaction Noted   Statins Other (See Comments) 01/03/2022    Past Medical History:  Diagnosis Date   CAD (coronary artery disease) 2011   DES to the proximal RCA   Dyslipidemia     GERD (gastroesophageal reflux disease)    Hypertension    NSTEMI (non-ST elevated myocardial infarction) (HCC) 2011   Tobacco dependence     Past Surgical History:  Procedure Laterality Date   CARDIAC CATHETERIZATION  01/28/2010   CORONARY STENT INTERVENTION N/A 08/30/2018   Procedure: CORONARY STENT INTERVENTION;  Surgeon: Swaziland, Peter M, MD;  Location: [facility];  Service: Cardiovascular;  Laterality: N/A;   HAND SURGERY     LEFT HAND   LEFT HEART CATH AND CORONARY ANGIOGRAPHY N/A 08/30/2018   Procedure: LEFT HEART CATH AND CORONARY ANGIOGRAPHY;  Surgeon: Swaziland, Peter M, MD;  Location: [facility] INVASIVE CV LAB;  Service: Cardiovascular;  Laterality: N/A;    Family History  Problem Relation Age of Onset   Hypertension Mother    Stroke Mother    Diabetes Mother    Lung cancer Mother    Emphysema Father     Social History   Socioeconomic History   Marital status: Married    Spouse name: Not on file   Number of children: 3   Years of education: Not on file   Highest education level: Not on file  Occupational History   Occupation: roofer  Tobacco Use   Smoking status: Every Day    Packs/day: 0.25    Types: Cigarettes   Smokeless tobacco: Never  Tobacco comments:    Smokes 4-5 cigarettes a day  Substance and Sexual Activity   Alcohol use: No   Drug use: No   Sexual activity: Yes  Other Topics Concern   Not on file  Social History Narrative   Not on file   Social Determinants of Health   Financial Resource Strain: Not on file  Food Insecurity: Not on file  Transportation Needs: Not on file  Physical Activity: Not on file  Stress: Not on file  Social Connections: Not on file  Intimate Partner Violence: Not on file    Review of Systems  Respiratory:  Positive for chest tightness and shortness of breath.     Vitals:   05/25/22 1000  BP: 136/70  Pulse: (!) 53  Temp: (!) 97.5 F (36.4 C)  SpO2: 97%     Physical Exam Constitutional:       Appearance: He is obese.  HENT:     Head: Normocephalic.     Mouth/Throat:     Mouth: Mucous membranes are moist.  Eyes:     Conjunctiva/sclera: Conjunctivae normal.  Cardiovascular:     Rate and Rhythm: Normal rate and regular rhythm.     Heart sounds: No murmur heard.    No friction rub.  Pulmonary:     Effort: No respiratory distress.     Breath sounds: No stridor. No wheezing or rhonchi.  Musculoskeletal:     Cervical back: No rigidity or tenderness.  Neurological:     Mental Status: He is alert.  Psychiatric:        Mood and Affect: Mood normal.    Data Reviewed: No previous x-ray  Assessment:  Likely obstructive lung disease  Not on any bronchodilators at present  Active smoker  May have a component of deconditioning  Does have underlying coronary artery disease although  treatment for this is being optimized at present  Plan/Recommendations: Obtain a pulmonary function test  Obtain a chest x-ray  Obtain low-dose CT with about 80-pack-year smoking history  Encourage graded exercise as tolerated  Prescription for Trelegy provided, samples provided  Follow-up in about 6 weeks   Virl Diamond MD [facility] 05/25/2022, 10:44 AM  CC: Lance Bosch, NP

## 2022-05-25 NOTE — Patient Instructions (Addendum)
We will start you on Trelegy  Chest x-ray today  Schedule for low-dose CT  Schedule for PFT at next visit in about 6 weeks  Graded exercises as tolerated-you have to start exercising regularly  You need to set a date that you are going to quit smoking  Call with significant concerns  We will see you in about 6 weeks

## 2022-05-25 NOTE — Addendum Note (Signed)
Addended byClyda Greener M on: 05/25/2022 01:53 PM   Modules accepted: Orders

## 2022-05-30 ENCOUNTER — Encounter: Payer: Self-pay | Admitting: Pulmonary Disease

## 2022-05-30 NOTE — Telephone Encounter (Signed)
Dr. Wynona Neat, please advise on pt's message regarding his CXR results. Thanks.

## 2022-05-31 NOTE — Telephone Encounter (Signed)
Chest xray is normal

## 2022-06-09 ENCOUNTER — Ambulatory Visit (HOSPITAL_COMMUNITY)
Admission: RE | Admit: 2022-06-09 | Discharge: 2022-06-09 | Disposition: A | Discharge status: Home / Self Care | Payer: Self-pay | Source: Ambulatory Visit | Attending: Pulmonary Disease | Admitting: Pulmonary Disease

## 2022-06-09 DIAGNOSIS — R059 Cough, unspecified: Secondary | ICD-10-CM | POA: Insufficient documentation

## 2022-06-09 DIAGNOSIS — I200 Unstable angina: Secondary | ICD-10-CM | POA: Insufficient documentation

## 2022-06-14 ENCOUNTER — Encounter: Payer: Self-pay | Admitting: Pulmonary Disease

## 2022-06-20 ENCOUNTER — Telehealth: Payer: Self-pay | Admitting: Pulmonary Disease

## 2022-06-20 DIAGNOSIS — R911 Solitary pulmonary nodule: Secondary | ICD-10-CM

## 2022-06-20 DIAGNOSIS — R059 Cough, unspecified: Secondary | ICD-10-CM

## 2022-06-20 DIAGNOSIS — I200 Unstable angina: Secondary | ICD-10-CM

## 2022-06-20 NOTE — Telephone Encounter (Signed)
Order placed for pt's CT to be completed in 1 year.

## 2022-06-20 NOTE — Telephone Encounter (Signed)
CT reviewed with emphysema, small lung nodule of 4 mm Evidence of liver cirrhosis  Nothing to change from a pulmonary perspective  Needs follow-up CT in a year to keep an eye on the nodule and for lung cancer screening   Continuing to smoke even at a much lower pace than what he did before will continue to put his lungs at risk and the potential for cancer may continue to increase  Strongly encouraged to quit smoking  Avoid alcohol, follow-up with your primary doctor regarding liver cirrhosis

## 2022-06-20 NOTE — Telephone Encounter (Signed)
Separate phone encounter open about pt's CT results. Nothing further needed.

## 2022-06-20 NOTE — Addendum Note (Signed)
Addended by: Wyvonne Lenz on: 06/20/2022 03:29 PM   Modules accepted: Orders

## 2022-06-20 NOTE — Telephone Encounter (Signed)
Mychart message sent by pt checking on the results of recent CT. Dr. Val Eagle, please advise.

## 2022-07-07 ENCOUNTER — Ambulatory Visit (INDEPENDENT_AMBULATORY_CARE_PROVIDER_SITE_OTHER): Payer: Self-pay | Admitting: Pulmonary Disease

## 2022-07-07 ENCOUNTER — Encounter: Payer: Self-pay | Admitting: Pulmonary Disease

## 2022-07-07 VITALS — BP 120/68 | HR 54 | Temp 97.6°F | Ht 64.0 in | Wt 199.0 lb

## 2022-07-07 DIAGNOSIS — R059 Cough, unspecified: Secondary | ICD-10-CM

## 2022-07-07 DIAGNOSIS — R0602 Shortness of breath: Secondary | ICD-10-CM

## 2022-07-07 DIAGNOSIS — I200 Unstable angina: Secondary | ICD-10-CM

## 2022-07-07 LAB — PULMONARY FUNCTION TEST
DL/VA % pred: 70 %
DL/VA: 3.01 ml/min/mmHg/L
DLCO cor % pred: 76 %
DLCO cor: 17.15 ml/min/mmHg
DLCO unc % pred: 76 %
DLCO unc: 17.15 ml/min/mmHg
FEF 25-75 Post: 2.33 L/sec
FEF 25-75 Pre: 1.91 L/sec
FEF2575-%Change-Post: 22 %
FEF2575-%Pred-Post: 102 %
FEF2575-%Pred-Pre: 83 %
FEV1-%Change-Post: 4 %
FEV1-%Pred-Post: 84 %
FEV1-%Pred-Pre: 80 %
FEV1-Post: 2.35 L
FEV1-Pre: 2.24 L
FEV1FVC-%Change-Post: 4 %
FEV1FVC-%Pred-Pre: 103 %
FEV6-%Change-Post: 1 %
FEV6-%Pred-Post: 83 %
FEV6-%Pred-Pre: 82 %
FEV6-Post: 2.91 L
FEV6-Pre: 2.88 L
FEV6FVC-%Change-Post: 0 %
FEV6FVC-%Pred-Post: 105 %
FEV6FVC-%Pred-Pre: 105 %
FVC-%Change-Post: 0 %
FVC-%Pred-Post: 78 %
FVC-%Pred-Pre: 78 %
FVC-Post: 2.91 L
FVC-Pre: 2.89 L
Post FEV1/FVC ratio: 81 %
Post FEV6/FVC ratio: 100 %
Pre FEV1/FVC ratio: 77 %
Pre FEV6/FVC Ratio: 100 %
RV % pred: 57 %
RV: 1.14 L
TLC % pred: 87 %
TLC: 5.05 L

## 2022-07-07 MED ORDER — IPRATROPIUM-ALBUTEROL 20-100 MCG/ACT IN AERS: 1.0000 | INHALATION_SPRAY | Freq: Four times a day (QID) | RESPIRATORY_TRACT | 6 refills | Status: DC | PRN

## 2022-07-07 NOTE — Progress Notes (Signed)
Philip Thomas    [id number]    1961-02-25  Primary Care Physician:Blevins, Sue Lush, NP  Referring Physician: Lance Bosch, NP [address] [place],  [place] [address]  Chief complaint:   Patient being seen for shortness of breath on exertion  HPI:  Shortness of breath on exertion for multiple m breathing feels about the same  Did get a rash within about a week of starting Trelegy Also did have some soreness in his throat  He stopped using Trelegy and things started to feel little bit better  He is still working on quitting smoking down to about 5 cigarettes a day Was able to go about 2 days without smoking but went back to smoking anyways  History of coronary artery disease, history of diabetes, history of hypercholesterolemia, s/p PCI  Spouse accompanied him for the visit today  Started smoking at age 60, at the heaviest was smoking about 2 packs a day  Outpatient Encounter Medications as of 07/07/2022  Medication Sig   acetaminophen (TYLENOL) 325 MG tablet Take 650 mg by mouth every 6 (six) hours as needed for moderate pain or headache.    aspirin 81 MG tablet Take 81 mg by mouth daily.     esomeprazole (NEXIUM) 20 MG capsule Take 20 mg by mouth every evening.   isosorbide mononitrate (IMDUR) 30 MG 24 hr tablet Take 0.5 tablets (15 mg total) by mouth daily.   metFORMIN (GLUCOPHAGE-XR) 500 MG 24 hr tablet Take 500 mg by mouth daily with breakfast. 1 Tablet Daily with Breakfast   metoprolol succinate (TOPROL-XL) 50 MG 24 hr tablet Take 50 mg by mouth daily.   nitroGLYCERIN (NITROSTAT) 0.4 MG SL tablet Place 1 tablet (0.4 mg total) under the tongue every 5 (five) minutes as needed.   rosuvastatin (CRESTOR) 5 MG tablet Take 1 tablet (5 mg total) by mouth daily.   [DISCONTINUED] Fluticasone-Umeclidin-Vilant (TRELEGY ELLIPTA) 100-62.5-25 MCG/ACT AEPB Inhale 1 puff into the lungs daily.   [DISCONTINUED] Fluticasone-Umeclidin-Vilant (TRELEGY ELLIPTA) 100-62.5-25  MCG/ACT AEPB Inhale 1 puff into the lungs daily. (Patient not taking: Reported on 07/07/2022)   No facility-administered encounter medications on file as of 07/07/2022.    Allergies as of 07/07/2022 - Review Complete 07/07/2022  Allergen Reaction Noted   Statins Other (See Comments) 01/03/2022    Past Medical History:  Diagnosis Date   CAD (coronary artery disease) 2011   DES to the proximal RCA   Dyslipidemia    GERD (gastroesophageal reflux disease)    Hypertension    NSTEMI (non-ST elevated myocardial infarction) (HCC) 2011   Tobacco dependence     Past Surgical History:  Procedure Laterality Date   CARDIAC CATHETERIZATION  01/28/2010   CORONARY STENT INTERVENTION N/A 08/30/2018   Procedure: CORONARY STENT INTERVENTION;  Surgeon: Swaziland, Peter M, MD;  Location: [facility]VASIVE CV LAB;  Service: Cardiovascular;  Laterality: N/A;   HAND SURGERY     LEFT HAND   LEFT HEART CATH AND CORONARY ANGIOGRAPHY N/A 08/30/2018   Procedure: LEFT HEART CATH AND CORONARY ANGIOGRAPHY;  Surgeon: Swaziland, Peter M, MD;  Location: [facility] INVASIVE CV LAB;  Service: Cardiovascular;  Laterality: N/A;    Family History  Problem Relation Age of Onset   Hypertension Mother    Stroke Mother    Diabetes Mother    Lung cancer Mother    Emphysema Father     Social History   Socioeconomic History   Marital status: Married    Spouse  name: Not on file   Number of children: 3   Years of education: Not on file   Highest education level: Not on file  Occupational History   Occupation: roofer  Tobacco Use   Smoking status: Every Day    Packs/day: 0.25    Types: Cigarettes   Smokeless tobacco: Never   Tobacco comments:    Smokes 4-5 cigarettes a day  Substance and Sexual Activity   Alcohol use: No   Drug use: No   Sexual activity: Yes  Other Topics Concern   Not on file  Social History Narrative   Not on file   Social Determinants of Health   Financial Resource Strain: Not on file  Food  Insecurity: Not on file  Transportation Needs: Not on file  Physical Activity: Not on file  Stress: Not on file  Social Connections: Not on file  Intimate Partner Violence: Not on file    Review of Systems  Respiratory:  Positive for chest tightness and shortness of breath.     Vitals:   07/07/22 1325  BP: 120/68  Pulse: (!) 54  Temp: 97.6 F (36.4 C)  SpO2: 95%     Physical Exam Constitutional:      Appearance: He is obese.  HENT:     Head: Normocephalic.     Mouth/Throat:     Mouth: Mucous membranes are moist.  Eyes:     Conjunctiva/sclera: Conjunctivae normal.  Cardiovascular:     Rate and Rhythm: Normal rate and regular rhythm.     Heart sounds: No murmur heard.    No friction rub.  Pulmonary:     Effort: No respiratory distress.     Breath sounds: No stridor. No wheezing or rhonchi.  Musculoskeletal:     Cervical back: No rigidity or tenderness.  Neurological:     Mental Status: He is alert.  Psychiatric:        Mood and Affect: Mood normal.   Data Reviewed: CT chest significant for emphysematous changes and small lung nodule  PFT is within normal limits  Assessment:  Obstructive lung disease with emphysema on CT scan of the chest  Did not tolerate Trelegy.  Yes.   Active smoker, continues to work on quitting smoking May have a component of deconditioning  Does have underlying coronary artery disease although  treatment for this is being optimized at present  Plan/Recommendations: Yearly low-dose CT scans  Prescription for Combivent to be used up to 4 times a day for shortness of breath  Encourage graded exercise as tolerated  Weight loss efforts as able  I will see him back in about 6 months   Virl Diamond MD [facility] 07/07/2022, 1:48 PM  CC: Lance Bosch, NP

## 2022-07-07 NOTE — Patient Instructions (Addendum)
Prescription for Combivent to be used up to 4 times a day as needed sent to pharmacy for you  Graded exercise as tolerated  Continue to work on quitting smoking  I will see you 6 months from here  Call with significant concerns

## 2022-07-07 NOTE — Progress Notes (Signed)
Full PFT performed today. °

## 2022-07-07 NOTE — Patient Instructions (Signed)
Full PFT performed today. °

## 2022-07-20 LAB — NMR, LIPOPROFILE
Cholesterol, Total: 161 mg/dL (ref 100–199)
HDL Particle Number: 20.7 umol/L — ABNORMAL LOW (ref >=30.5)
HDL-C: 28 mg/dL — ABNORMAL LOW (ref >39)
LDL Particle Number: 1314 nmol/L — ABNORMAL HIGH (ref <1000)
LDL Size: 20.3 nm — ABNORMAL LOW (ref >20.5)
LDL-C (NIH Calc): 103 mg/dL — ABNORMAL HIGH (ref 0–99)
LP-IR Score: 67 — ABNORMAL HIGH (ref <=45)
Small LDL Particle Number: 701 nmol/L — ABNORMAL HIGH (ref <=527)
Triglycerides: 171 mg/dL — ABNORMAL HIGH (ref 0–149)

## 2022-07-28 ENCOUNTER — Encounter: Payer: Self-pay | Admitting: Internal Medicine

## 2022-07-28 ENCOUNTER — Ambulatory Visit: Payer: Self-pay | Attending: Internal Medicine | Admitting: Internal Medicine

## 2022-07-28 VITALS — BP 116/68 | HR 54 | Ht 64.0 in | Wt 199.0 lb

## 2022-07-28 DIAGNOSIS — I2510 Atherosclerotic heart disease of native coronary artery without angina pectoris: Secondary | ICD-10-CM

## 2022-07-28 DIAGNOSIS — E785 Hyperlipidemia, unspecified: Secondary | ICD-10-CM

## 2022-07-28 DIAGNOSIS — I10 Essential (primary) hypertension: Secondary | ICD-10-CM

## 2022-07-28 MED ORDER — ROSUVASTATIN CALCIUM 20 MG PO TABS: 20.0000 mg | ORAL_TABLET | Freq: Every day | ORAL | 3 refills | Status: DC

## 2022-07-28 NOTE — Patient Instructions (Signed)
Medication Instructions:  INCREASE crestor to 20mg  daily  *If you need a refill on your cardiac medications before your next appointment, please call your pharmacy*   Lab Work: FASTING lab work to check cholesterol in 3-4 months  If you have labs (blood work) drawn today and your tests are completely normal, you will receive your results only by: [place] (if you have MyChart) OR A paper copy in the mail If you have any lab test that is abnormal or we need to change your treatment, we will call you to review the results.   Testing/Procedures: NONE   Follow-Up: At [facility], you and your health needs are our priority.  As part of our continuing mission to provide you with exceptional heart care, we have created designated Provider Care Teams.  These Care Teams include your primary Cardiologist (physician) and Advanced Practice Providers (APPs -  Physician Assistants and Nurse Practitioners) who all work together to provide you with the care you need, when you need it.  We recommend signing up for the patient portal called "MyChart".  Sign up information is provided on this After Visit Summary.  MyChart is used to connect with patients for Virtual Visits (Telemedicine).  Patients are able to view lab/test results, encounter notes, upcoming appointments, etc.  Non-urgent messages can be sent to your provider as well.   To learn more about what you can do with MyChart, go to [email or web address].    Your next appointment:    3-4 months with Dr. Debara Pickett

## 2022-07-28 NOTE — Progress Notes (Signed)
OFFICE NOTE  Chief Complaint:  Chest pain, jaw pain  Primary Care Physician: Lance Bosch, NP  HPI:  Philip Thomas is a 62 y.o. male with a past medial history significant for coronary artery disease with an STEMI in 2011 status post DES to the proximal RCA.  He also has dyslipidemia, hypertension and GERD as well as ongoing tobacco abuse.  He was last seen by Dr. Swaziland in 2015 at that time was not compliant with medications or smoking cessation.  He is therefore considered a new patient.  Since then he has had no further medical care.  He does not currently have insurance.  He is not taking any medications including aspirin on a regular basis.  Recently has been having worsening tightness in his chest and episodes of pain that are squeezing and associated with a pressure that radiates up to the jaw on both sides with occluded tightness in his throat.  Describes these episodes as intense, more 9-10 out of 10 in pain severity and they do improve eventually with rest however seem to be coming more frequently.  10/28/2019  Philip Thomas is seen today in follow-up.  Very 2020 underwent left heart catheterization and coronary angiography which showed 95% stenosis of the mid RCA status post DES.  There was some residual disease in the LAD and circumflex.  LVEF was normal.  The patient was recommended to be on Brilinta and aspirin for 1 year.  There was concern because he had lack of health insurance.  Ultimately has been paying for the medication however the cost of other medications has risen significantly.  Today is accompanied by his wife and overall seems to be doing well although recently has been reporting some shortness of breath.  This is particular with exertion and lifting objects.  He says it has been over the past several months and similar to his symptoms prior to his PCI in February 2020.  I was discussing with him the possibility of stopping Brilinta however I am concerned this could be  an anginal equivalent.  From a medical standpoint he is doing much better.  He is no longer on glimepiride, but takes Metformin 500 mg twice daily and hemoglobin A1c was 5.6.  Lipids show total cholesterol 97, triglycerides 90, HDL 37 and LDL of 42.  EKG today shows sinus rhythm at 67.  No new ischemic changes were noted.  12/01/2019  Philip Thomas returns today for follow-up.  He says that his dyspnea is somewhat better.  He did undergo nuclear stress testing which demonstrated an EF of 54% and a small area of inferior infarct.  No reversible ischemia was noted.  This is similar to findings in the past.  He had a known inferior infarct and prior stenting to the RCA.  Based on this finding I think he is at acceptable risk for any further procedures.  From a cardiac standpoint I advised him to discontinue his Brilinta and remain on aspirin monotherapy.  06/08/2020  Philip Thomas returns today for follow-up .  Overall he says he is doing really well.  He denies any chest pain or worsening shortness of breath.  He has good blood pressure control today.  EKG shows a normal sinus rhythm.  He has had excellent control over his lipids in the past but is due for repeat lipid profile soon.  His only concern is that he is describing some soreness across his upper chest and upper back as well as his arms  bilaterally.  He says is fairly constant and feels like a muscle ache.  He says it is almost like he is worked out too much at [employer] and has been sore for quite a while.  01/03/2022  Philip Thomas is seen today in follow-up.  Is been several years since we saw him.  Recently over the past several months he has had some worsening chest discomfort as well as neck and jaw pain.  These are symptoms that were very similar to his symptoms prior to stenting back in 2020.  Unfortunately he continues to smoke.  His PCP had put him on bupropion but he has not had a good response to that or actually tried to stop smoking.  EKG today  shows a sinus bradycardia heart rate in the 40s.  He did have recent lab work which showed total cholesterol 143, triglycerides 111, HDL 31 and LDL 91.  He could not tolerate atorvastatin 40 mg daily due to muscle aches which resolved after stopping it.  He was then switched to 20 mg of rosuvastatin which also cause similar problems.  His PCP then dropped his rosuvastatin down to 5 mg 3 times a week.  He is tolerating that but is only been on it for a few weeks prior to this most recent lab work.  His target LDL is less than 70.  I am concerned about him having possibly recurrent anginal symptoms.  07/28/2022  Philip Thomas is seen today in follow-up.  He is doing well without any recurrent chest pain.  His cholesterol however has not improved significantly.  There is been some minor changes.  He is taking rosuvastatin 5 out of 7 days a week but previously it was 3 days a week.  He has not noted any side effects with the medication.  He is LDL particle number is 1314, down from 1517.  There is been a small increase in LDL-C from 90-103.  Triglycerides are lower at 171 from 275.  Small LDL particle number is lower at 701 from 1303.  He overall though remains above target for somebody with coronary disease.    PMHx:  Past Medical History:  Diagnosis Date   CAD (coronary artery disease) 2011   DES to the proximal RCA   Dyslipidemia    GERD (gastroesophageal reflux disease)    Hypertension    NSTEMI (non-ST elevated myocardial infarction) ([place]) 2011   Tobacco dependence     Past Surgical History:  Procedure Laterality Date   CARDIAC CATHETERIZATION  01/28/2010   CORONARY STENT INTERVENTION N/A 08/30/2018   Procedure: CORONARY STENT INTERVENTION;  Surgeon: Martinique, Peter M, MD;  Location: [place] [facility];  Service: Cardiovascular;  Laterality: N/A;   HAND SURGERY     LEFT HAND   LEFT HEART CATH AND CORONARY ANGIOGRAPHY N/A 08/30/2018   Procedure: LEFT HEART CATH AND CORONARY ANGIOGRAPHY;  Surgeon:  Martinique, Peter M, MD;  Location: [place] [facility];  Service: Cardiovascular;  Laterality: N/A;    FAMHx:  Family History  Problem Relation Age of Onset   Hypertension Mother    Stroke Mother    Diabetes Mother    Lung cancer Mother    Emphysema Father     SOCHx:   reports that he has been smoking cigarettes. He has been smoking an average of .25 packs per day. He has never used smokeless tobacco. He reports that he does not drink alcohol and does not use drugs.  ALLERGIES:  Allergies  Allergen Reactions   Statins Other (See Comments)    myalgias    ROS: Pertinent items noted in HPI and remainder of comprehensive ROS otherwise negative.  HOME MEDS: Current Outpatient Medications on File Prior to Visit  Medication Sig Dispense Refill   acetaminophen (TYLENOL) 325 MG tablet Take 650 mg by mouth every 6 (six) hours as needed for moderate pain or headache.      aspirin 81 MG tablet Take 81 mg by mouth daily.       esomeprazole (NEXIUM) 20 MG capsule Take 20 mg by mouth every evening.     isosorbide mononitrate (IMDUR) 30 MG 24 hr tablet Take 0.5 tablets (15 mg total) by mouth daily. 45 tablet 3   metFORMIN (GLUCOPHAGE-XR) 500 MG 24 hr tablet Take 1,000 mg by mouth daily with breakfast. 2 Tablets Daily with Breakfast     metoprolol succinate (TOPROL-XL) 50 MG 24 hr tablet Take 50 mg by mouth daily.     nitroGLYCERIN (NITROSTAT) 0.4 MG SL tablet Place 1 tablet (0.4 mg total) under the tongue every 5 (five) minutes as needed. 25 tablet 2   rosuvastatin (CRESTOR) 5 MG tablet Take 1 tablet (5 mg total) by mouth daily. (Patient taking differently: Take 5 mg by mouth daily. 5 days a week) 90 tablet 3   No current facility-administered medications on file prior to visit.    LABS/IMAGING: No results found for this or any previous visit (from the past 48 hour(s)). No results found.  LIPID PANEL:    Component Value Date/Time   CHOL 97 (L) 11/21/2018 0816   TRIG 90 11/21/2018 0816    HDL 37 (L) 11/21/2018 0816   CHOLHDL 2.6 11/21/2018 0816   CHOLHDL 5 12/30/2013 1004   VLDL 45.4 (H) 12/30/2013 1004   LDLCALC 42 11/21/2018 0816   LDLDIRECT 69.2 09/17/2012 0826     WEIGHTS: Wt Readings from Last 3 Encounters:  07/28/22 199 lb (90.3 kg)  07/07/22 199 lb (90.3 kg)  05/25/22 196 lb 12.8 oz (89.3 kg)    VITALS: BP 116/68   Pulse (!) 54   Ht 5\' 4"  (1.626 m)   Wt 199 lb (90.3 kg)   SpO2 96%   BMI 34.16 kg/m   EXAM: Deferred  EKG: Deferred  ASSESSMENT: Chest pain/jaw pain - improved on imdur Statin myalgia Progressive dyspnea on exertion-low risk Myoview stress test, LVEF 54% with small area of inferior infarct (10/2019) Dyslipidemia Hypertension CAD with prior an STEMI and drug-eluting stent to the RCA (2011) and recurrent coronary disease with PCI to the mid RCA (08/2018)  PLAN: 1.   Philip Thomas denies any chest pain or worsening shortness of breath and seems to be stable on Imdur and less he does very heavy exertion.  He has a history of statin myalgia but is tolerating low-dose rosuvastatin for 5 days a week.  Unfortunately this is not enough to normalize his lipid profile.  I advised an increase to 20 mg rosuvastatin which is guideline directed therapy for patient with coronary artery disease.  Hopefully this will be tolerated.  If it is not we will go back to 5 mg and add ezetimibe.  Plan follow-up with me in 3 to 4 months.  Pixie Casino, MD, [facility], [place] Director of the Advanced Lipid Disorders &  Cardiovascular Risk Reduction Clinic Diplomate of the American Board of Clinical Lipidology Attending Cardiologist  Direct Dial: [phone]  Fax: [phone]  Website:  [email or web address]  Lisette Abu Annett Boxwell 07/28/2022, 8:32 AM

## 2022-11-01 LAB — NMR, LIPOPROFILE
Cholesterol, Total: 119 mg/dL (ref 100–199)
HDL Particle Number: 21.6 umol/L — ABNORMAL LOW (ref >=30.5)
HDL-C: 27 mg/dL — ABNORMAL LOW (ref >39)
LDL Particle Number: 753 nmol/L (ref <1000)
LDL Size: 20.4 nm — ABNORMAL LOW (ref >20.5)
LDL-C (NIH Calc): 68 mg/dL (ref 0–99)
LP-IR Score: 60 — ABNORMAL HIGH (ref <=45)
Small LDL Particle Number: 439 nmol/L (ref <=527)
Triglycerides: 135 mg/dL (ref 0–149)

## 2022-11-04 ENCOUNTER — Ambulatory Visit: Payer: Self-pay | Admitting: Internal Medicine

## 2022-11-23 ENCOUNTER — Ambulatory Visit (INDEPENDENT_AMBULATORY_CARE_PROVIDER_SITE_OTHER): Payer: Self-pay | Admitting: Internal Medicine

## 2022-11-23 ENCOUNTER — Encounter (HOSPITAL_BASED_OUTPATIENT_CLINIC_OR_DEPARTMENT_OTHER): Payer: Self-pay | Admitting: Internal Medicine

## 2022-11-23 VITALS — BP 120/76 | HR 52 | Ht 64.0 in | Wt 197.8 lb

## 2022-11-23 DIAGNOSIS — K7460 Unspecified cirrhosis of liver: Secondary | ICD-10-CM

## 2022-11-23 DIAGNOSIS — I2510 Atherosclerotic heart disease of native coronary artery without angina pectoris: Secondary | ICD-10-CM

## 2022-11-23 DIAGNOSIS — E785 Hyperlipidemia, unspecified: Secondary | ICD-10-CM

## 2022-11-23 NOTE — Progress Notes (Signed)
OFFICE NOTE  Chief Complaint:  No complaints  Primary Care Physician: Lance Bosch, NP  HPI:  Philip Thomas is a 62 y.o. male with a past medial history significant for coronary artery disease with an STEMI in 2011 status post DES to the proximal RCA.  He also has dyslipidemia, hypertension and GERD as well as ongoing tobacco abuse.  He was last seen by Dr. Swaziland in 2015 at that time was not compliant with medications or smoking cessation.  He is therefore considered a new patient.  Since then he has had no further medical care.  He does not currently have insurance.  He is not taking any medications including aspirin on a regular basis.  Recently has been having worsening tightness in his chest and episodes of pain that are squeezing and associated with a pressure that radiates up to the jaw on both sides with occluded tightness in his throat.  Describes these episodes as intense, more 9-10 out of 10 in pain severity and they do improve eventually with rest however seem to be coming more frequently.  10/28/2019  Philip Thomas is seen today in follow-up.  Very 2020 underwent left heart catheterization and coronary angiography which showed 95% stenosis of the mid RCA status post DES.  There was some residual disease in the LAD and circumflex.  LVEF was normal.  The patient was recommended to be on Brilinta and aspirin for 1 year.  There was concern because he had lack of health insurance.  Ultimately has been paying for the medication however the cost of other medications has risen significantly.  Today is accompanied by his wife and overall seems to be doing well although recently has been reporting some shortness of breath.  This is particular with exertion and lifting objects.  He says it has been over the past several months and similar to his symptoms prior to his PCI in February 2020.  I was discussing with him the possibility of stopping Brilinta however I am concerned this could be an  anginal equivalent.  From a medical standpoint he is doing much better.  He is no longer on glimepiride, but takes Metformin 500 mg twice daily and hemoglobin A1c was 5.6.  Lipids show total cholesterol 97, triglycerides 90, HDL 37 and LDL of 42.  EKG today shows sinus rhythm at 67.  No new ischemic changes were noted.  12/01/2019  Philip Thomas returns today for follow-up.  He says that his dyspnea is somewhat better.  He did undergo nuclear stress testing which demonstrated an EF of 54% and a small area of inferior infarct.  No reversible ischemia was noted.  This is similar to findings in the past.  He had a known inferior infarct and prior stenting to the RCA.  Based on this finding I think he is at acceptable risk for any further procedures.  From a cardiac standpoint I advised him to discontinue his Brilinta and remain on aspirin monotherapy.  06/08/2020  Philip Thomas returns today for follow-up .  Overall he says he is doing really well.  He denies any chest pain or worsening shortness of breath.  He has good blood pressure control today.  EKG shows a normal sinus rhythm.  He has had excellent control over his lipids in the past but is due for repeat lipid profile soon.  His only concern is that he is describing some soreness across his upper chest and upper back as well as his arms bilaterally.  He says is fairly constant and feels like a muscle ache.  He says it is almost like he is worked out too much at [employer] and has been sore for quite a while.  01/03/2022  Philip Thomas is seen today in follow-up.  Is been several years since we saw him.  Recently over the past several months he has had some worsening chest discomfort as well as neck and jaw pain.  These are symptoms that were very similar to his symptoms prior to stenting back in 2020.  Unfortunately he continues to smoke.  His PCP had put him on bupropion but he has not had a good response to that or actually tried to stop smoking.  EKG today  shows a sinus bradycardia heart rate in the 40s.  He did have recent lab work which showed total cholesterol 143, triglycerides 111, HDL 31 and LDL 91.  He could not tolerate atorvastatin 40 mg daily due to muscle aches which resolved after stopping it.  He was then switched to 20 mg of rosuvastatin which also cause similar problems.  His PCP then dropped his rosuvastatin down to 5 mg 3 times a week.  He is tolerating that but is only been on it for a few weeks prior to this most recent lab work.  His target LDL is less than 70.  I am concerned about him having possibly recurrent anginal symptoms.  07/28/2022  Philip Thomas is seen today in follow-up.  He is doing well without any recurrent chest pain.  His cholesterol however has not improved significantly.  There is been some minor changes.  He is taking rosuvastatin 5 out of 7 days a week but previously it was 3 days a week.  He has not noted any side effects with the medication.  He is LDL particle number is 1314, down from 1517.  There is been a small increase in LDL-C from 90-103.  Triglycerides are lower at 171 from 275.  Small LDL particle number is lower at 701 from 1303.  He overall though remains above target for somebody with coronary disease.    11/23/2022  Philip Thomas is seen today in follow-up.  Overall he is doing very well.  He seems to be tolerating the higher dose statin.  He has had a marked improvement in his lipids including an LDL particle number of 753, down from 1517.  His LDL-C is now down to 68 with HDL low at 27 and triglycerides 135.  His small LDL particle number is now normal at 439 but previously was as high as 1303.  Overall he says he feels well.  He denies any angina.  LP(a) was previously assessed and is negative.  He is try to cut back on smoking.  He does work with pulmonary on this.  He had a screening CT for lung cancer.  This was negative but did note multivessel coronary artery disease which is known and also advanced  cirrhosis.  When I asked him about this he said it was never addressed.  He has not spoken with his PCP regarding it.  He has not had any liver enzymes since 2021.  He did have an ultrasound of his abdomen back in 2021 for thrombocytopenia, which showed splenomegaly and fatty liver with early changes of cirrhosis.  PMHx:  Past Medical History:  Diagnosis Date   CAD (coronary artery disease) 2011   DES to the proximal RCA   Dyslipidemia    GERD (gastroesophageal reflux  disease)    Hypertension    NSTEMI (non-ST elevated myocardial infarction) ([facility]) 2011   Tobacco dependence     Past Surgical History:  Procedure Laterality Date   CARDIAC CATHETERIZATION  01/28/2010   CORONARY STENT INTERVENTION N/A 08/30/2018   Procedure: CORONARY STENT INTERVENTION;  Surgeon: Swaziland, Peter M, MD;  Location: [facility];  Service: Cardiovascular;  Laterality: N/A;   HAND SURGERY     LEFT HAND   LEFT HEART CATH AND CORONARY ANGIOGRAPHY N/A 08/30/2018   Procedure: LEFT HEART CATH AND CORONARY ANGIOGRAPHY;  Surgeon: Swaziland, Peter M, MD;  Location: [facility];  Service: Cardiovascular;  Laterality: N/A;    FAMHx:  Family History  Problem Relation Age of Onset   Hypertension Mother    Stroke Mother    Diabetes Mother    Lung cancer Mother    Emphysema Father     SOCHx:   reports that he has been smoking cigarettes. He has been smoking an average of .25 packs per day. He has never used smokeless tobacco. He reports that he does not drink alcohol and does not use drugs.  ALLERGIES:  Allergies  Allergen Reactions   Statins Other (See Comments)    myalgias    ROS: Pertinent items noted in HPI and remainder of comprehensive ROS otherwise negative.  HOME MEDS: Current Outpatient Medications on File Prior to Visit  Medication Sig Dispense Refill   acetaminophen (TYLENOL) 325 MG tablet Take 650 mg by mouth every 6 (six) hours as needed for moderate pain or headache.      aspirin 81 MG  tablet Take 81 mg by mouth daily.       esomeprazole (NEXIUM) 20 MG capsule Take 20 mg by mouth every evening.     isosorbide mononitrate (IMDUR) 30 MG 24 hr tablet Take 0.5 tablets (15 mg total) by mouth daily. 45 tablet 3   metFORMIN (GLUCOPHAGE-XR) 500 MG 24 hr tablet Take 1,000 mg by mouth daily with breakfast. 2 Tablets Daily with Breakfast     metoprolol succinate (TOPROL-XL) 50 MG 24 hr tablet Take 50 mg by mouth daily.     nitroGLYCERIN (NITROSTAT) 0.4 MG SL tablet Place 1 tablet (0.4 mg total) under the tongue every 5 (five) minutes as needed. 25 tablet 2   rosuvastatin (CRESTOR) 20 MG tablet Take 1 tablet (20 mg total) by mouth daily. 90 tablet 3   No current facility-administered medications on file prior to visit.    LABS/IMAGING: No results found for this or any previous visit (from the past 48 hour(s)). No results found.  LIPID PANEL:    Component Value Date/Time   CHOL 97 (L) 11/21/2018 0816   TRIG 90 11/21/2018 0816   HDL 37 (L) 11/21/2018 0816   CHOLHDL 2.6 11/21/2018 0816   CHOLHDL 5 12/30/2013 1004   VLDL 45.4 (H) 12/30/2013 1004   LDLCALC 42 11/21/2018 0816   LDLDIRECT 69.2 09/17/2012 0826     WEIGHTS: Wt Readings from Last 3 Encounters:  11/23/22 197 lb 12.8 oz (89.7 kg)  07/28/22 199 lb (90.3 kg)  07/07/22 199 lb (90.3 kg)    VITALS: BP (!) 165/75 (BP Location: Left Arm, Patient Position: Sitting, Cuff Size: Normal)   Pulse (!) 52   Ht 5\' 4"  (1.626 m)   Wt 197 lb 12.8 oz (89.7 kg)   BMI 33.95 kg/m   EXAM: Deferred  EKG: Deferred  ASSESSMENT: Chest pain/jaw pain - improved on imdur Statin myalgia Progressive dyspnea on  exertion-low risk Myoview stress test, LVEF 54% with small area of inferior infarct (10/2019) Dyslipidemia Hypertension CAD with prior an STEMI and drug-eluting stent to the RCA (2011) and recurrent coronary disease with PCI to the mid RCA (08/2018) Liver cirrhosis  PLAN: 1.   Mr. Poche is doing much better with marked  improvement in his cholesterol on statin therapy.  Recent CT scan demonstrated "advanced cirrhosis of the liver".  He did have ultrasound back in 2021 which showed possible early cirrhosis and fatty liver.  He was also noted to have splenomegaly and had had some thrombocytopenia.  I would like to repeat liver enzymes and check a hepatitis panel however I suspect this is likely progressive fatty liver disease.  He should be evaluated by a liver specialist and I have taken the liberty to refer him to that specialist.  Plan follow-up with me in 6 months or sooner as necessary.  Chrystie Nose, MD, [facility], FACP  [place]  [facility]  Medical Director of the Advanced Lipid Disorders &  Cardiovascular Risk Reduction Clinic Diplomate of the American Board of Clinical Lipidology Attending Cardiologist  Direct Dial: [phone]  Fax: [phone]  Website:  [email or web address] Nicely Annalia Metzger 11/23/2022, 2:54 PM

## 2022-11-23 NOTE — Patient Instructions (Signed)
Medication Instructions:  Your physician recommends that you continue on your current medications as directed. Please refer to the Current Medication list given to you today.  *If you need a refill on your cardiac medications before your next appointment, please call your pharmacy*  Lab Work: Hepatic, hepatitis panel today  If you have labs (blood work) drawn today and your tests are completely normal, you will receive your results only by: MyChart Message (if you have MyChart) OR A paper copy in the mail If you have any lab test that is abnormal or we need to change your treatment, we will call you to review the results.  Follow-Up: At [facility], you and your health needs are our priority.  As part of our continuing mission to provide you with exceptional heart care, we have created designated Provider Care Teams.  These Care Teams include your primary Cardiologist (physician) and Advanced Practice Providers (APPs -  Physician Assistants and Nurse Practitioners) who all work together to provide you with the care you need, when you need it.  We recommend signing up for the patient portal called "MyChart".  Sign up information is provided on this After Visit Summary.  MyChart is used to connect with patients for Virtual Visits (Telemedicine).  Patients are able to view lab/test results, encounter notes, upcoming appointments, etc.  Non-urgent messages can be sent to your provider as well.   To learn more about what you can do with MyChart, go to [email or web address].    Your next appointment:   6 month(s)  Provider:   Chrystie Nose, MD     Other Instructions You have been referred to: Hepatology-Dawn Drazek NP

## 2022-11-24 LAB — VIRAL HEPATITIS HBV, HCV
HCV Ab: NONREACTIVE
Hep B Core Total Ab: NEGATIVE
Hep B Surface Ab, Qual: NONREACTIVE
Hepatitis B Surface Ag: NEGATIVE

## 2022-11-24 LAB — HEPATIC FUNCTION PANEL
ALT: 20 IU/L (ref 0–44)
AST: 25 IU/L (ref 0–40)
Albumin: 4.2 g/dL (ref 3.9–4.9)
Alkaline Phosphatase: 62 IU/L (ref 44–121)
Bilirubin Total: 0.8 mg/dL (ref 0.0–1.2)
Bilirubin, Direct: 0.24 mg/dL (ref 0.00–0.40)
Total Protein: 7.4 g/dL (ref 6.0–8.5)

## 2022-11-24 LAB — HCV INTERPRETATION

## 2022-12-30 ENCOUNTER — Other Ambulatory Visit: Payer: Self-pay | Admitting: Internal Medicine

## 2022-12-30 MED ORDER — ISOSORBIDE MONONITRATE ER 30 MG PO TB24: 15.0000 mg | ORAL_TABLET | Freq: Every day | ORAL | 3 refills | Status: DC

## 2023-01-03 ENCOUNTER — Telehealth: Payer: Self-pay | Admitting: Licensed Clinical Social Worker

## 2023-01-03 NOTE — Telephone Encounter (Addendum)
H&V Care Navigation CSW Progress Note  Clinical Social Worker  received an email from financial counselor Mellody Dance  to send me update about pt discount approval under [employer]. I will add this to pt chart.  PATIENT APPROVED FOR 75% FINANCIAL ASSISTANCE PER CAFA APPLICATION AND SUPPORTING DOCUMENTATION. CAFA APP SCANNED TO ACCOUNT [id number] APPROVAL DATES OF [employer] ---- 12/01/22 - 06/03/23 APPROVAL LETTER ON ACCOUNT [id number]    Patient is participating in a Managed Medicaid Plan:  No, self pay only, 75% discount  SDOH Screenings   Tobacco Use: High Risk (11/23/2022)    Philip Thomas, MSW, LCSW Clinical Social Worker II [facility] Heart/Vascular Care Navigation  [phone]- work cell phone (preferred) [phone]- desk phone

## 2023-01-09 ENCOUNTER — Telehealth (HOSPITAL_BASED_OUTPATIENT_CLINIC_OR_DEPARTMENT_OTHER): Payer: Self-pay | Admitting: Internal Medicine

## 2023-01-09 MED ORDER — METOPROLOL SUCCINATE ER 50 MG PO TB24: 50.0000 mg | ORAL_TABLET | Freq: Every day | ORAL | 3 refills | Status: DC

## 2023-01-09 NOTE — Telephone Encounter (Signed)
Pt of Dr. Rennis Golden. Please review refill of Metoprolol Succinate 50 mg. Thank you!

## 2023-01-09 NOTE — Telephone Encounter (Signed)
Called to speak with patient regarding outgoing referral to [facility]---he needs a refill fon Metoprolol succinate 50 mg  called to [facility]

## 2023-01-10 ENCOUNTER — Telehealth: Payer: Self-pay | Admitting: Internal Medicine

## 2023-01-10 MED ORDER — METOPROLOL SUCCINATE ER 50 MG PO TB24: 50.0000 mg | ORAL_TABLET | Freq: Every day | ORAL | 3 refills | Status: DC

## 2023-01-10 NOTE — Telephone Encounter (Signed)
*  STAT* If patient is at the pharmacy, call can be transferred to refill team.   1. Which medications need to be refilled? (please list name of each medication and dose if known) metoprolol succinate (TOPROL-XL) 50 MG 24 hr tablet   2. Which pharmacy/location (including street and city if local pharmacy) is medication to be sent to?  [facility] - [place], [place] - [address]    3. Do they need a 30 day or 90 day supply? 90  

## 2023-01-10 NOTE — Telephone Encounter (Signed)
Refill for Metoprolol has been sent to [facility], per pt's request.

## 2023-01-20 ENCOUNTER — Telehealth: Payer: Self-pay | Admitting: Internal Medicine

## 2023-01-20 NOTE — Telephone Encounter (Signed)
Left detailed message for the pt that there is a prescription at [facility] that was written for a year and he could either have them transfer his rx to his new pharmacy or have his new pharmacy call [facility] and get it transferred and if he had further questions, he can call us back.

## 2023-01-20 NOTE — Telephone Encounter (Signed)
Returned call to wife and she stated she will call [facility] to have Rx transferred but before I could finish my statement she hung up the phone.

## 2023-01-20 NOTE — Telephone Encounter (Signed)
Patient's wife is returning call with further questions.

## 2023-01-20 NOTE — Telephone Encounter (Signed)
*  STAT* If patient is at the pharmacy, call can be transferred to refill team.   1. Which medications need to be refilled? (please list name of each medication and dose if known) rosuvastatin (CRESTOR) 20 MG tablet   2. Which pharmacy/location (including street and city if local pharmacy) is medication to be sent to? [facility] - [place], [place] - [address]   3. Do they need a 30 day or 90 day supply? 90

## 2023-03-06 ENCOUNTER — Other Ambulatory Visit: Payer: Self-pay | Admitting: Nurse Practitioner

## 2023-03-06 DIAGNOSIS — K7581 Nonalcoholic steatohepatitis (NASH): Secondary | ICD-10-CM

## 2023-03-06 DIAGNOSIS — Z5989 Other problems related to housing and economic circumstances: Secondary | ICD-10-CM

## 2023-03-06 DIAGNOSIS — K7469 Other cirrhosis of liver: Secondary | ICD-10-CM

## 2023-03-10 ENCOUNTER — Other Ambulatory Visit: Payer: Self-pay

## 2023-03-14 ENCOUNTER — Ambulatory Visit
Admission: RE | Admit: 2023-03-14 | Discharge: 2023-03-14 | Disposition: A | Discharge status: Home / Self Care | Payer: No Typology Code available for payment source | Source: Ambulatory Visit | Attending: Nurse Practitioner | Admitting: Nurse Practitioner

## 2023-03-14 DIAGNOSIS — Z5989 Other problems related to housing and economic circumstances: Secondary | ICD-10-CM

## 2023-03-14 DIAGNOSIS — K7581 Nonalcoholic steatohepatitis (NASH): Secondary | ICD-10-CM

## 2023-03-14 DIAGNOSIS — K7469 Other cirrhosis of liver: Secondary | ICD-10-CM

## 2023-05-30 ENCOUNTER — Ambulatory Visit (HOSPITAL_BASED_OUTPATIENT_CLINIC_OR_DEPARTMENT_OTHER): Payer: Self-pay | Admitting: Internal Medicine

## 2023-06-13 ENCOUNTER — Telehealth: Payer: Self-pay | Admitting: Licensed Clinical Social Worker

## 2023-06-13 NOTE — Telephone Encounter (Signed)
H&V Care Navigation CSW Progress Note  Clinical Social Worker  mailed pt CAFA application  to renew pt assistance, previous CAFA assistance expired 11/23- will need outstanding bill to re-apply. Will note that on application for pt to submit once that's done.   Patient is participating in a Managed Medicaid Plan:  No, self pay only.   SDOH Screenings   Food Insecurity: Low Risk  (03/06/2023)   Received from [facility]  Transportation Needs: No Transportation Needs (03/06/2023)   Received from [facility]  Utilities: Low Risk  (03/06/2023)   Received from [facility]  Social Connections: Unknown (11/23/2021)   Received from [facility]  Tobacco Use: High Risk (03/06/2023)   Received from [facility]   Octavio Graves, MSW, LCSW Clinical Social Worker II [facility] Heart/Vascular Care Navigation  [phone]- work cell phone (preferred) [phone]- desk phone

## 2023-06-19 ENCOUNTER — Ambulatory Visit (HOSPITAL_COMMUNITY): Payer: No Typology Code available for payment source

## 2023-06-21 ENCOUNTER — Ambulatory Visit (HOSPITAL_COMMUNITY): Payer: No Typology Code available for payment source

## 2023-07-25 ENCOUNTER — Other Ambulatory Visit: Payer: Self-pay | Admitting: Internal Medicine

## 2023-09-10 ENCOUNTER — Ambulatory Visit

## 2023-10-24 ENCOUNTER — Other Ambulatory Visit: Payer: Self-pay | Admitting: Internal Medicine

## 2024-01-11 DIAGNOSIS — E119 Type 2 diabetes mellitus without complications: Secondary | ICD-10-CM | POA: Diagnosis not present

## 2024-01-11 DIAGNOSIS — I10 Essential (primary) hypertension: Secondary | ICD-10-CM | POA: Diagnosis not present

## 2024-01-13 LAB — LAB REPORT - SCANNED
A1c: 5.9
Albumin, Urine POC: 37.1
Albumin/Creatinine Ratio, Urine, POC: 39
Creatinine, POC: 95.6 mg/dL
EGFR (Non-African Amer.): 95

## 2024-01-20 ENCOUNTER — Other Ambulatory Visit: Payer: Self-pay | Admitting: Internal Medicine

## 2024-01-23 ENCOUNTER — Telehealth: Payer: Self-pay | Admitting: Internal Medicine

## 2024-01-23 NOTE — Telephone Encounter (Signed)
*  STAT* If patient is at the pharmacy, call can be transferred to refill team.   1. Which medications need to be refilled? (please list name of each medication and dose if known)   isosorbide  mononitrate (IMDUR ) 30 MG 24 hr tablet    metoprolol  succinate (TOPROL -XL) 50 MG 24 hr tablet    rosuvastatin  (CRESTOR ) 20 MG tablet    2. Which pharmacy/location (including street and city if local pharmacy) is medication to be sent to? [facility] - [place], [place] - [address] [address]   3. Do they need a 30 day or 90 day supply? 90  Patient has an appt 7/28

## 2024-01-29 MED ORDER — ROSUVASTATIN CALCIUM 20 MG PO TABS: 20.0000 mg | ORAL_TABLET | Freq: Every day | ORAL | 0 refills | Status: DC

## 2024-01-29 MED ORDER — METOPROLOL SUCCINATE ER 50 MG PO TB24: 50.0000 mg | ORAL_TABLET | Freq: Every day | ORAL | 0 refills | Status: DC

## 2024-01-29 MED ORDER — ISOSORBIDE MONONITRATE ER 30 MG PO TB24: 15.0000 mg | ORAL_TABLET | Freq: Every day | ORAL | 0 refills | Status: DC

## 2024-01-29 NOTE — Telephone Encounter (Signed)
 RX sent to requested Pharmacy

## 2024-01-29 NOTE — Telephone Encounter (Signed)
 Wife called again and stated patient will need refill prescriptions to last until he has his appointment on 7/28.

## 2024-02-05 ENCOUNTER — Encounter: Payer: Self-pay | Admitting: Nurse Practitioner

## 2024-02-05 ENCOUNTER — Ambulatory Visit: Payer: Self-pay | Attending: Nurse Practitioner | Admitting: Nurse Practitioner

## 2024-02-05 VITALS — BP 104/66 | HR 58 | Ht 64.0 in | Wt 178.6 lb

## 2024-02-05 DIAGNOSIS — I10 Essential (primary) hypertension: Secondary | ICD-10-CM | POA: Diagnosis not present

## 2024-02-05 DIAGNOSIS — I2510 Atherosclerotic heart disease of native coronary artery without angina pectoris: Secondary | ICD-10-CM | POA: Diagnosis not present

## 2024-02-05 DIAGNOSIS — R0609 Other forms of dyspnea: Secondary | ICD-10-CM

## 2024-02-05 DIAGNOSIS — K760 Fatty (change of) liver, not elsewhere classified: Secondary | ICD-10-CM

## 2024-02-05 DIAGNOSIS — E785 Hyperlipidemia, unspecified: Secondary | ICD-10-CM

## 2024-02-05 DIAGNOSIS — F17200 Nicotine dependence, unspecified, uncomplicated: Secondary | ICD-10-CM

## 2024-02-05 MED ORDER — METOPROLOL SUCCINATE ER 50 MG PO TB24: 50.0000 mg | ORAL_TABLET | Freq: Every day | ORAL | 3 refills | Status: AC

## 2024-02-05 MED ORDER — ISOSORBIDE MONONITRATE ER 30 MG PO TB24: 15.0000 mg | ORAL_TABLET | Freq: Every day | ORAL | 3 refills | Status: AC

## 2024-02-05 MED ORDER — NITROGLYCERIN 0.4 MG SL SUBL: 0.4000 mg | SUBLINGUAL_TABLET | SUBLINGUAL | 3 refills | Status: AC | PRN

## 2024-02-05 MED ORDER — ROSUVASTATIN CALCIUM 20 MG PO TABS: 20.0000 mg | ORAL_TABLET | Freq: Every day | ORAL | 3 refills | Status: AC

## 2024-02-05 NOTE — Patient Instructions (Signed)
 Medication Instructions:  Your physician recommends that you continue on your current medications as directed. Please refer to the Current Medication list given to you today.  *If you need a refill on your cardiac medications before your next appointment, please call your pharmacy*  Lab Work: NONE ordered at this time of appointment   Testing/Procedures: NONE ordered at this time of appointment   Follow-Up: At [facility], you and your health needs are our priority.  As part of our continuing mission to provide you with exceptional heart care, our providers are all part of one team.  This team includes your primary Cardiologist (physician) and Advanced Practice Providers or APPs (Physician Assistants and Nurse Practitioners) who all work together to provide you with the care you need, when you need it.  Your next appointment:   1 year(s)  Provider:   Vinie JAYSON Maxcy, MD    We recommend signing up for the patient portal called MyChart.  Sign up information is provided on this After Visit Summary.  MyChart is used to connect with patients for Virtual Visits (Telemedicine).  Patients are able to view lab/test results, encounter notes, upcoming appointments, etc.  Non-urgent messages can be sent to your provider as well.   To learn more about what you can do with MyChart, go to [email or web address].

## 2024-02-05 NOTE — Progress Notes (Signed)
 Office Visit    Patient Name: Philip Thomas Date of Encounter: 02/05/2024  Primary Care Provider:  Barbra Odor, NP Primary Cardiologist:  Vinie JAYSON Maxcy, MD  Chief Complaint    63 year old male with a history of CAD s/p DES-pRCA in 2011, DES-mRCA in 2020, chronic dyspnea on exertion, hypertension, hyperlipidemia, GERD, liver cirrhosis, hepatic steatosis, splenomegaly, and tobacco use who presents for follow-up related to CAD.  Past Medical History    Past Medical History:  Diagnosis Date   CAD (coronary artery disease) 2011   DES to the proximal RCA   Dyslipidemia    GERD (gastroesophageal reflux disease)    Hypertension    NSTEMI (non-ST elevated myocardial infarction) (HCC) 2011   Tobacco dependence    Past Surgical History:  Procedure Laterality Date   CARDIAC CATHETERIZATION  01/28/2010   CORONARY STENT INTERVENTION N/A 08/30/2018   Procedure: CORONARY STENT INTERVENTION;  Surgeon: Swaziland, Peter M, MD;  Location: [facility] INVASIVE CV LAB;  Service: Cardiovascular;  Laterality: N/A;   HAND SURGERY     LEFT HAND   LEFT HEART CATH AND CORONARY ANGIOGRAPHY N/A 08/30/2018   Procedure: LEFT HEART CATH AND CORONARY ANGIOGRAPHY;  Surgeon: Swaziland, Peter M, MD;  Location: [facility] INVASIVE CV LAB;  Service: Cardiovascular;  Laterality: N/A;    Allergies  Allergies  Allergen Reactions   Statins Other (See Comments)    myalgias     Labs/Other Studies Reviewed    The following studies were reviewed today:  Cardiac Studies & Procedures   ______________________________________________________________________________________________ CARDIAC CATHETERIZATION  CARDIAC CATHETERIZATION 08/30/2018  Conclusion  Mid RCA lesion is 95% stenosed.  A drug-eluting stent was successfully placed using a STENT SYNERGY DES 3X20.  Post intervention, there is a 0% residual stenosis.  Previously placed Prox RCA to Mid RCA stent (unknown type) is widely patent.  Prox LAD lesion is 40%  stenosed.  Prox Cx lesion is 30% stenosed.  The left ventricular systolic function is normal.  LV end diastolic pressure is normal.  The left ventricular ejection fraction is 55-65% by visual estimate.  1. Single vessel obstructive CAD involving the mid RCA 2. Normal LV function 3. Normal LVEDP 4. Successful PCI of the mid RCA with DES x 1.  Plan: Patient is a candidate for same day DC. DAPT for one year. Will start with Brilinta . If unable to afford could switch to Plavix after one month. Risk factor modification.  Findings Coronary Findings Diagnostic  Dominance: Right  Left Main Vessel was injected. Vessel is normal in caliber. Vessel is angiographically normal.  Left Anterior Descending Prox LAD lesion is 40% stenosed. The lesion is mildly calcified.  Left Circumflex Prox Cx lesion is 30% stenosed.  Right Coronary Artery Previously placed Prox RCA to Mid RCA stent (unknown type) is widely patent. Mid RCA lesion is 95% stenosed. The lesion is segmental. The lesion is mildly calcified.  Intervention  Mid RCA lesion Stent CATH VISTA GUIDE 6FR JR4 guide catheter was inserted. Lesion crossed with guidewire using a WIRE ASAHI PROWATER 180CM. Pre-stent angioplasty was performed using a BALLOON SAPPHIRE 2.5X15. A drug-eluting stent was successfully placed using a STENT SYNERGY DES 3X20. Stent strut is well apposed. Post-stent angioplasty was performed using a BALLOON SAPPHIRE [place] 3.25X12. Maximum pressure:  16 atm. Post-Intervention Lesion Assessment The intervention was successful. Pre-interventional TIMI flow is 3. Post-intervention TIMI flow is 3. No complications occurred at this lesion. There is a 0% residual stenosis post intervention.   STRESS TESTS  MYOCARDIAL PERFUSION  IMAGING 01/26/2022  Interpretation Summary   The study is normal. The study is low risk.   No ST deviation was noted.   Left ventricular function is normal. End diastolic cavity size is normal.    Prior study available for comparison from 11/01/2019.            ______________________________________________________________________________________________     Recent Labs: No results found for requested labs within last 365 days.  Recent Lipid Panel    Component Value Date/Time   CHOL 97 (L) 11/21/2018 0816   TRIG 90 11/21/2018 0816   HDL 37 (L) 11/21/2018 0816   CHOLHDL 2.6 11/21/2018 0816   CHOLHDL 5 12/30/2013 1004   VLDL 45.4 (H) 12/30/2013 1004   LDLCALC 42 11/21/2018 0816   LDLDIRECT 69.2 09/17/2012 0826    History of Present Illness    63 year old male with the above past medical history including CAD s/p DES-pRCA in 2011, DES-mRCA in 2020, chronic dyspnea on exertion, hypertension, hyperlipidemia, GERD, liver cirrhosis, hepatic steatosis, splenomegaly, and tobacco use.  He was hospitalized in 2011 in the setting of STEMI.  He underwent stenting to his proximal RCA.  Repeat cardiac catheterization in 2020 revealed 95% stenosis of the mid RCA, s/p DES.  There was some residual disease in the LAD and LCx, EF was normal.  Myoview  in 2021 showed EF 54%, small area of inferior infarct, no reversible ischemia.  Nuclear stress test in 01/2022 was low risk, no significant ischemia.  He was last seen in the office on 11/23/2022 and was stable from a cardiac standpoint.  He was referred to a liver specialist.  He presents today for follow-up accompanied by his wife.  Since his last visit he has been stable from a cardiac standpoint.  He has had improvement in his dyspnea since starting Trelegy, followed by pulmonology.  He denies symptoms concerning for angina.  EKG today shows sinus rhythm with PVCs, he is asymptomatic.  Overall, he reports feeling well.    Home Medications    Current Outpatient Medications  Medication Sig Dispense Refill   acetaminophen  (TYLENOL ) 325 MG tablet Take 650 mg by mouth every 6 (six) hours as needed for moderate pain or headache.      aspirin  81 MG  tablet Take 81 mg by mouth daily.       esomeprazole (NEXIUM) 20 MG capsule Take 20 mg by mouth every evening.     isosorbide  mononitrate (IMDUR ) 30 MG 24 hr tablet Take 0.5 tablets (15 mg total) by mouth daily. 45 tablet 3   metFORMIN (GLUCOPHAGE-XR) 500 MG 24 hr tablet Take 1,000 mg by mouth daily with breakfast. 2 Tablets Daily with Breakfast     metoprolol  succinate (TOPROL -XL) 50 MG 24 hr tablet Take 1 tablet (50 mg total) by mouth daily. 90 tablet 3   nitroGLYCERIN  (NITROSTAT ) 0.4 MG SL tablet Place 1 tablet (0.4 mg total) under the tongue every 5 (five) minutes as needed. 25 tablet 3   rosuvastatin  (CRESTOR ) 20 MG tablet Take 1 tablet (20 mg total) by mouth daily. 90 tablet 3   No current facility-administered medications for this visit.     Review of Systems    He denies chest pain, palpitations, dyspnea, pnd, orthopnea, n, v, dizziness, syncope, edema, weight gain, or early satiety. All other systems reviewed and are otherwise negative except as noted above.   Physical Exam    VS:  BP 104/66   Pulse (!) 58   Ht 5' 4 (1.626 m)  Wt 178 lb 9.6 oz (81 kg)   SpO2 96%   BMI 30.66 kg/m   GEN: Well nourished, well developed, in no acute distress. HEENT: normal. Neck: Supple, no JVD, carotid bruits, or masses. Cardiac: RRR, no murmurs, rubs, or gallops. No clubbing, cyanosis, edema.  Radials/DP/PT 2+ and equal bilaterally.  Respiratory:  Respirations regular and unlabored, clear to auscultation bilaterally. GI: Soft, nontender, nondistended, BS + x 4. MS: no deformity or atrophy. Skin: warm and dry, no rash. Neuro:  Strength and sensation are intact. Psych: Normal affect.  Accessory Clinical Findings    ECG personally reviewed by me today - EKG Interpretation Date/Time:  Monday February 05 2024 14:26:42 EDT Ventricular Rate:  58 PR Interval:  140 QRS Duration:  92 QT Interval:  424 QTC Calculation: 416 R Axis:   -37  Text Interpretation: Sinus bradycardia with occasional  Premature ventricular complexes Left axis deviation When compared with ECG of 30-Aug-2018 08:41, Premature ventricular complexes are now Present QRS axis Shifted left Confirmed by Daneen Perkins ([id number]) on 02/05/2024 2:34:35 PM  - no acute changes.   Lab Results  Component Value Date   WBC 4.7 03/24/2020   HGB 14.0 03/24/2020   HCT 42.6 03/24/2020   MCV 85.9 03/24/2020   PLT 89 (L) 03/24/2020   Lab Results  Component Value Date   CREATININE 0.79 03/24/2020   BUN 12 03/24/2020   NA 138 03/24/2020   K 4.0 03/24/2020   CL 109 03/24/2020   CO2 24 03/24/2020   Lab Results  Component Value Date   ALT 20 11/23/2022   AST 25 11/23/2022   ALKPHOS 62 11/23/2022   BILITOT 0.8 11/23/2022   Lab Results  Component Value Date   CHOL 97 (L) 11/21/2018   HDL 37 (L) 11/21/2018   LDLCALC 42 11/21/2018   LDLDIRECT 69.2 09/17/2012   TRIG 90 11/21/2018   CHOLHDL 2.6 11/21/2018    Lab Results  Component Value Date   HGBA1C 10.4 (H) 09/07/2018    Assessment & Plan    1. CAD/Dyspnea on exertion: S/p DES-pRCA in 2011, DES-mRCA in 2020. Nuclear stress test in 01/2022 was low risk, no significant ischemia. Stable with no anginal symptoms. No indication for ischemic evaluation. Continue aspirin , metoprolol , Imdur , Crestor .   2.  PVCs: Noted on today's EKG. Asymptomatic.  Continue metoprolol .  3. Hypertension: BP well controlled. Continue current antihypertensive regimen.   4. Hyperlipidemia: LDL was 60 in 01/2024. Continue Crestor .  5. Hepatic steatosis/cirrhosis: Following with liver specialist.  6. Prediabetes:  A1c was 5.9 in 01/2024.  Monitored and managed per PCP.  7. Tobacco use: He continues to smoke. Full cessation advised.  8. Disposition: Follow-up in 1 year.       Perkins JAYSON Daneen, NP 02/05/2024, 9:22 PM

## 2024-02-09 ENCOUNTER — Encounter: Payer: Self-pay | Admitting: Nurse Practitioner

## 2024-04-08 ENCOUNTER — Other Ambulatory Visit: Payer: Self-pay | Admitting: Nurse Practitioner

## 2024-04-08 DIAGNOSIS — K7581 Nonalcoholic steatohepatitis (NASH): Secondary | ICD-10-CM

## 2024-04-08 DIAGNOSIS — K7469 Other cirrhosis of liver: Secondary | ICD-10-CM

## 2024-04-08 DIAGNOSIS — R911 Solitary pulmonary nodule: Secondary | ICD-10-CM | POA: Diagnosis not present

## 2024-04-08 DIAGNOSIS — K766 Portal hypertension: Secondary | ICD-10-CM | POA: Diagnosis not present

## 2024-04-15 ENCOUNTER — Ambulatory Visit
Admission: RE | Admit: 2024-04-15 | Discharge: 2024-04-15 | Disposition: A | Source: Ambulatory Visit | Attending: Nurse Practitioner

## 2024-04-15 DIAGNOSIS — K8020 Calculus of gallbladder without cholecystitis without obstruction: Secondary | ICD-10-CM | POA: Diagnosis not present

## 2024-04-15 DIAGNOSIS — K7469 Other cirrhosis of liver: Secondary | ICD-10-CM

## 2024-04-15 DIAGNOSIS — K7581 Nonalcoholic steatohepatitis (NASH): Secondary | ICD-10-CM

## 2024-04-15 DIAGNOSIS — K7460 Unspecified cirrhosis of liver: Secondary | ICD-10-CM | POA: Diagnosis not present

## 2024-06-25 ENCOUNTER — Encounter: Payer: Self-pay | Admitting: Gastroenterology

## 2024-08-05 ENCOUNTER — Ambulatory Visit: Admitting: Gastroenterology

## 2024-08-08 ENCOUNTER — Other Ambulatory Visit: Payer: Self-pay | Admitting: Nurse Practitioner

## 2024-08-08 DIAGNOSIS — Z720 Tobacco use: Secondary | ICD-10-CM

## 2024-09-02 ENCOUNTER — Ambulatory Visit: Admitting: Gastroenterology
